# Patient Record
Sex: Female | Born: 1985 | Race: White | Hispanic: No | Marital: Married
Health system: Midwestern US, Academic
[De-identification: ages and names within clinical notes are randomized; demographics above are authoritative.]

---

## 2016-09-05 MED ORDER — ACETAMINOPHEN 500 MG PO TAB
1000 mg | Freq: Once | ORAL | 0 refills | Status: CP
Start: 2016-09-05 — End: ?

## 2016-09-05 MED ORDER — HYDROCODONE-ACETAMINOPHEN 5-325 MG PO TAB
1 | ORAL_TABLET | ORAL | 0 refills | 15.00000 days | Status: DC | PRN
Start: 2016-09-05 — End: 2017-01-19

## 2016-09-05 MED ORDER — LIDOCAINE 5 % TP PTMD
1 | MEDICATED_PATCH | Freq: Every day | TOPICAL | 1 refills | 30.00000 days | Status: DC
Start: 2016-09-05 — End: 2016-09-05

## 2016-09-05 MED ORDER — LIDOCAINE 5 % TP PTMD
1 | MEDICATED_PATCH | Freq: Every day | TOPICAL | 1 refills | 30.00000 days | Status: DC
Start: 2016-09-05 — End: 2017-01-19

## 2016-09-05 MED ORDER — LIDOCAINE 5 % TP PTMD
1 | Freq: Every day | TOPICAL | 0 refills | Status: DC
Start: 2016-09-05 — End: 2016-09-05

## 2017-01-19 ENCOUNTER — Ambulatory Visit: Admit: 2017-01-19 | Discharge: 2017-01-20 | Payer: BC Managed Care – PPO

## 2017-01-19 ENCOUNTER — Encounter: Admit: 2017-01-19 | Discharge: 2017-01-19 | Payer: BC Managed Care – PPO

## 2017-01-19 DIAGNOSIS — IMO0001 Blues: ICD-10-CM

## 2017-01-19 DIAGNOSIS — Z8619 Personal history of other infectious and parasitic diseases: Principal | ICD-10-CM

## 2017-01-19 DIAGNOSIS — D649 Anemia, unspecified: ICD-10-CM

## 2017-01-19 DIAGNOSIS — R51 Headache: ICD-10-CM

## 2017-01-19 DIAGNOSIS — Z0000 Encounter for general adult medical examination without abnormal findings: Principal | ICD-10-CM

## 2017-01-19 DIAGNOSIS — G43009 Migraine without aura, not intractable, without status migrainosus: ICD-10-CM

## 2017-01-19 DIAGNOSIS — E039 Hypothyroidism, unspecified: ICD-10-CM

## 2017-01-19 DIAGNOSIS — Z8669 Personal history of other diseases of the nervous system and sense organs: ICD-10-CM

## 2017-01-19 DIAGNOSIS — F419 Anxiety disorder, unspecified: ICD-10-CM

## 2017-01-19 LAB — CBC AND DIFF
Lab: 1.1 mL/min (ref >60)
Lab: 12 % (ref 11.5–14.5)
Lab: 12 g/dL (ref 12.00–14.00)
Lab: 2.7 mg/dL (ref 2.0–7.8)
Lab: 215 10*3/uL — ABNORMAL HIGH (ref 140–440)
Lab: 27 % (ref >60)
Lab: 29 pg (ref 26–32)
Lab: 34 g/dL (ref 31–36)
Lab: 4.2 M/uL (ref 4.20–6.30)
Lab: 63 % (ref 37.0–92.0)
Lab: 88 fL (ref 80–97)
Lab: 9.7 % (ref 0.1–24.0)
Lab: 0.4 (ref 0.0–1.8)

## 2017-01-19 MED ORDER — RIZATRIPTAN 10 MG PO TAB
10 mg | ORAL_TABLET | Freq: Every day | ORAL | 0 refills | Status: AC | PRN
Start: 2017-01-19 — End: 2017-11-01

## 2017-01-19 NOTE — Progress Notes
Date of service 01/19/2017    Kara Ponce presents today for a Complete Physical Exam.    HPI  She presents today for her physical.  She is a 93-month-old baby and 29-month-old.  She is going back to work next week.  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  She unfortunately developed fully when she was 2 [redacted] weeks pregnant and was admitted to [facility].  She delivered her baby at [facility].  She never had any dose changes with her thyroid during either of her pregnancies.  She is getting up 2-3 times a night with her children.  She does recall that she was found to have some anemia during her pregnancy.  She was put on iron every other day in addition to her prenatal vitamin.  This is causing a little bit of constipation.  She infrequently has migraines.  She used to take Maxalt and would like to have another prescription on hand just in case.    Past Medical History:   Diagnosis Date   ??? Anxiety    ??? Blues    ??? Generalized headaches    ??? History of chicken pox    ??? History of migraine headaches    ??? Hypothyroidism      Past Surgical History:   Procedure Laterality Date   ??? HX WISDOM TEETH EXTRACTION  2005   ??? CYSTOSCOPY  08/13/2009    NORMAL     Family History   Problem Relation Age of Onset   ??? Hypertension Mother    ??? Thyroid Disease Mother    ??? Heart Disease Mother    ??? Hypertension Father    ??? Stroke Father    ??? Hearing Loss Father    ??? High Cholesterol Father    ??? Diabetes Maternal Grandfather    ??? Heart Disease Paternal Grandmother    ??? Heart Disease Paternal Grandfather    ??? Anesthetic Complication Neg Hx    ??? Cancer-Breast Neg Hx    ??? Cancer-Colon Neg Hx    ??? Cancer-Ovarian Neg Hx    ??? Cancer-Uterine Neg Hx    ??? Bleeding Disorders Neg Hx    ??? VTE Neg Hx    ??? Cancer Neg Hx      Social History   Substance Use Topics   ??? Smoking status: Never Smoker   ??? Smokeless tobacco: Never Used   ??? 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  47-month-old boy and 57-month-old daughter.  Walking regularly. Review of Systems   Constitution: Negative for chills, decreased appetite, diaphoresis, fever, weakness, malaise/fatigue, night sweats and weight gain.   HENT: Negative for ear pain and hearing loss.    Eyes: Negative for visual disturbance.   Cardiovascular: Negative for chest pain, dyspnea on exertion and leg swelling.   Respiratory: Negative for cough and shortness of breath.    Skin: Negative for rash.   Musculoskeletal: Negative for joint swelling.   Gastrointestinal: Negative for abdominal pain, change in bowel habit, constipation, diarrhea, melena and nausea.   Genitourinary: Negative for bladder incontinence and dysuria.   Neurological: Negative for light-headedness and numbness.   Psychiatric/Behavioral: Negative for depression. The patient is not nervous/anxious.        Allergies   Allergen Reactions   ??? Sulfa (Sulfonamide Antibiotics)        Objective:     ??? cholecalciferol (VITAMIN D-3) 1,000 units tablet Take 1,000 Units by mouth daily.   ???  ferrous sulfate (FEOSOL, FEROSUL) 325 mg (65 mg iron) tablet Take 325 mg by mouth daily. Take on an empty stomach at least 1 hour before or 2 hours after food.   ??? fish oil- omega 3-DHA/EPA 300/1,000 mg capsule Take 1 capsule by mouth daily.   ??? levothyroxine (SYNTHROID) 75 mcg tablet TAKE 1 TABLET BY MOUTH EVERY DAY   ??? rizatriptan (MAXALT) 10 mg tablet Take one tablet by mouth daily as needed. May repeat in 2 hours in needed   ??? vitamins, prenatal w/iron & folate 65/1 mg tab Take 1 Tab by mouth daily.     Vitals:    01/19/17 1155   BP: 106/67   Pulse: 67   Resp: 16   Temp: 36.8 ???C (98.2 ???F)   TempSrc: Oral   SpO2: 98%   Weight: 62.2 kg (137 lb 3.2 oz)   Height: 158.8 cm (62.5)     Body mass index is 24.69 kg/m???.   Discussed patient's BMI with her.  The body mass index is 24.69 kg/m???.  no follow up action or recommendations are necessary at this time.    Physical Exam     General: Pleasant well developed, well nourished female in no acute distress. Heent: Pupils equal round, reactive to light. Extraocular movements intact. Mucous membranes moist, oropharynx clear. Neck: No LAD, thyromegaly or jvp. Lungs: clear throughout, normal respiratory effort. CV: Regular rate and rhythm, no murmur. 2+ distal pulses. Abdomen: Soft, non-tender, non-distended, +BS, no masses. Ext: no clubbing, cyanosis or edema. Warm and dry. Neuro: Alert and oriented x 3; CNII-XII intact, no focal deficits. Psy: Mood: good. Affect: normal.            Assessment and Plan:  1. Wellness exam: WNL. Tdap 3/18. Flu vax yrly fall. Pap 08/27/14, q 3y. IUD placed yesterday.  2.  Hypothyroidism.  Recheck thyroid function today.  On levothyroxine.  3.  Migraines.  Maxalt prescription sent.  4.  Anemia.  Recheck CBC today.  Further recommendations regarding her every other day iron to follow lab review.    1 yr sooner if needed

## 2017-01-20 ENCOUNTER — Encounter: Admit: 2017-01-20 | Discharge: 2017-01-19 | Payer: BC Managed Care – PPO

## 2017-01-20 LAB — THYROID STIMULATING HORMONE-TSH: Lab: 0.9 uU/mL (ref 0.35–5.00)

## 2017-02-12 ENCOUNTER — Encounter: Admit: 2017-02-12 | Discharge: 2017-02-12 | Payer: BC Managed Care – PPO

## 2017-02-12 MED ORDER — LEVOTHYROXINE 75 MCG PO TAB
75 ug | ORAL_TABLET | Freq: Every day | ORAL | 2 refills | 30.00000 days | Status: AC
Start: 2017-02-12 — End: 2017-12-19

## 2017-08-06 ENCOUNTER — Encounter: Admit: 2017-08-06 | Discharge: 2017-08-06 | Payer: BC Managed Care – PPO

## 2017-08-31 ENCOUNTER — Encounter: Admit: 2017-08-31 | Discharge: 2017-08-31 | Payer: BC Managed Care – PPO

## 2017-08-31 ENCOUNTER — Ambulatory Visit: Admit: 2017-08-31 | Discharge: 2017-09-01 | Payer: BC Managed Care – PPO

## 2017-08-31 DIAGNOSIS — R350 Frequency of micturition: Principal | ICD-10-CM

## 2017-08-31 DIAGNOSIS — IMO0001 Blues: ICD-10-CM

## 2017-08-31 DIAGNOSIS — E039 Hypothyroidism, unspecified: ICD-10-CM

## 2017-08-31 DIAGNOSIS — F419 Anxiety disorder, unspecified: ICD-10-CM

## 2017-08-31 DIAGNOSIS — R51 Headache: ICD-10-CM

## 2017-08-31 DIAGNOSIS — Z8669 Personal history of other diseases of the nervous system and sense organs: ICD-10-CM

## 2017-08-31 DIAGNOSIS — Z8619 Personal history of other infectious and parasitic diseases: Principal | ICD-10-CM

## 2017-08-31 LAB — URINALYSIS DIPSTICK
Lab: 0.2 (ref 0.2–1.0)
Lab: 1 (ref 1.050–1.025)
Lab: 6 mL/min (ref >60)
Lab: NEGATIVE
Lab: NEGATIVE
Lab: NEGATIVE
Lab: NEGATIVE
Lab: NEGATIVE
Lab: NEGATIVE
Lab: NEGATIVE

## 2017-09-02 LAB — CULTURE-URINE W/SENSITIVITY: Lab: 3

## 2017-09-05 ENCOUNTER — Encounter: Admit: 2017-09-05 | Discharge: 2017-09-05 | Payer: BC Managed Care – PPO

## 2017-09-05 ENCOUNTER — Ambulatory Visit: Admit: 2017-09-05 | Discharge: 2017-09-05 | Payer: BC Managed Care – PPO

## 2017-09-05 DIAGNOSIS — F419 Anxiety disorder, unspecified: ICD-10-CM

## 2017-09-05 DIAGNOSIS — Z8669 Personal history of other diseases of the nervous system and sense organs: ICD-10-CM

## 2017-09-05 DIAGNOSIS — G43909 Migraine, unspecified, not intractable, without status migrainosus: ICD-10-CM

## 2017-09-05 DIAGNOSIS — E039 Hypothyroidism, unspecified: ICD-10-CM

## 2017-09-05 DIAGNOSIS — R350 Frequency of micturition: Principal | ICD-10-CM

## 2017-09-05 DIAGNOSIS — R51 Headache: ICD-10-CM

## 2017-09-05 DIAGNOSIS — IMO0001 Blues: ICD-10-CM

## 2017-09-05 DIAGNOSIS — Z8619 Personal history of other infectious and parasitic diseases: Principal | ICD-10-CM

## 2017-09-05 LAB — URINALYSIS DIPSTICK REFLEX TO CULTURE
Lab: NEGATIVE
Lab: NEGATIVE
Lab: NEGATIVE
Lab: NEGATIVE
Lab: NEGATIVE

## 2017-09-05 LAB — URINALYSIS MICROSCOPIC REFLEX TO CULTURE

## 2017-09-06 ENCOUNTER — Encounter: Admit: 2017-09-06 | Discharge: 2017-09-06 | Payer: BC Managed Care – PPO

## 2017-10-19 ENCOUNTER — Encounter: Admit: 2017-10-19 | Discharge: 2017-10-19 | Payer: BC Managed Care – PPO

## 2017-10-19 ENCOUNTER — Ambulatory Visit: Admit: 2017-10-19 | Discharge: 2017-10-19 | Payer: BC Managed Care – PPO

## 2017-10-19 DIAGNOSIS — Z8669 Personal history of other diseases of the nervous system and sense organs: ICD-10-CM

## 2017-10-19 DIAGNOSIS — Z8619 Personal history of other infectious and parasitic diseases: Principal | ICD-10-CM

## 2017-10-19 DIAGNOSIS — E039 Hypothyroidism, unspecified: ICD-10-CM

## 2017-10-19 DIAGNOSIS — R200 Anesthesia of skin: Principal | ICD-10-CM

## 2017-10-19 DIAGNOSIS — IMO0001 Blues: ICD-10-CM

## 2017-10-19 DIAGNOSIS — G5621 Lesion of ulnar nerve, right upper limb: ICD-10-CM

## 2017-10-19 DIAGNOSIS — R51 Headache: ICD-10-CM

## 2017-10-19 DIAGNOSIS — F419 Anxiety disorder, unspecified: ICD-10-CM

## 2017-10-19 DIAGNOSIS — G43909 Migraine, unspecified, not intractable, without status migrainosus: ICD-10-CM

## 2017-11-01 ENCOUNTER — Encounter: Admit: 2017-11-01 | Discharge: 2017-11-01 | Payer: BC Managed Care – PPO

## 2017-11-01 DIAGNOSIS — G43009 Migraine without aura, not intractable, without status migrainosus: Principal | ICD-10-CM

## 2017-11-01 MED ORDER — RIZATRIPTAN 10 MG PO TAB
10 mg | ORAL_TABLET | Freq: Every day | ORAL | 1 refills | Status: AC | PRN
Start: 2017-11-01 — End: 2018-01-04

## 2017-11-30 ENCOUNTER — Encounter: Admit: 2017-11-30 | Discharge: 2017-11-30 | Payer: BC Managed Care – PPO

## 2017-11-30 ENCOUNTER — Emergency Department
Admit: 2017-11-30 | Discharge: 2017-11-30 | Disposition: A | Discharge status: Home / Self Care | Payer: BC Managed Care – PPO

## 2017-11-30 ENCOUNTER — Emergency Department: Admit: 2017-11-30 | Discharge: 2017-11-30 | Payer: BC Managed Care – PPO

## 2017-11-30 DIAGNOSIS — K7689 Other specified diseases of liver: ICD-10-CM

## 2017-11-30 DIAGNOSIS — M6289 Other specified disorders of muscle: ICD-10-CM

## 2017-11-30 DIAGNOSIS — I82890 Acute embolism and thrombosis of other specified veins: Principal | ICD-10-CM

## 2017-11-30 DIAGNOSIS — K769 Liver disease, unspecified: Principal | ICD-10-CM

## 2017-11-30 DIAGNOSIS — K5900 Constipation, unspecified: ICD-10-CM

## 2017-11-30 DIAGNOSIS — Z8719 Personal history of other diseases of the digestive system: ICD-10-CM

## 2017-11-30 LAB — COMPREHENSIVE METABOLIC PANEL
Lab: 138 MMOL/L — ABNORMAL HIGH (ref 137–147)
Lab: 6 10*3/uL — ABNORMAL LOW (ref 3–12)
Lab: >60 mL/min (ref >60)
Lab: >60 mL/min (ref >60)

## 2017-11-30 LAB — URINALYSIS, MICROSCOPIC

## 2017-11-30 LAB — CBC AND DIFF
Lab: 0 10*3/uL (ref 0–0.20)
Lab: 0.1 10*3/uL (ref 0–0.45)
Lab: 3.7 10*3/uL — ABNORMAL LOW (ref 4.5–11.0)

## 2017-11-30 LAB — URINALYSIS DIPSTICK
Lab: NEGATIVE MMOL/L (ref 21–30)
Lab: NEGATIVE U/L (ref 7–40)
Lab: NEGATIVE U/L — ABNORMAL LOW (ref 7–56)
Lab: NEGATIVE g/dL (ref 3.5–5.0)
Lab: NEGATIVE g/dL (ref 6.0–8.0)
Lab: NEGATIVE mg/dL (ref 0.3–1.2)
Lab: NEGATIVE mg/dL (ref 8.5–10.6)

## 2017-11-30 LAB — POC CREATININE, RAD: Lab: 0.7 mg/dL — ABNORMAL LOW (ref 0.4–1.00)

## 2017-11-30 LAB — LIPASE: Lab: 26 U/L — AB (ref 11–82)

## 2017-11-30 MED ORDER — SODIUM CHLORIDE 0.9 % IJ SOLN
50 mL | Freq: Once | INTRAVENOUS | 0 refills | Status: CP
Start: 2017-11-30 — End: ?
  Administered 2017-11-30: 13:00:00 50 mL via INTRAVENOUS

## 2017-11-30 MED ORDER — MILK/MOLASSES(#) 1:1 RECTAL ENEMA
1 | Freq: Once | RECTAL | 0 refills | Status: CP
Start: 2017-11-30 — End: ?
  Administered 2017-11-30: 14:00:00 200 mL via RECTAL

## 2017-11-30 MED ORDER — LACTATED RINGERS IV SOLP
1000 mL | INTRAVENOUS | 0 refills | Status: CP
Start: 2017-11-30 — End: ?
  Administered 2017-11-30: 13:00:00 1000 mL via INTRAVENOUS

## 2017-11-30 MED ORDER — IOHEXOL 350 MG IODINE/ML IV SOLN
70 mL | Freq: Once | INTRAVENOUS | 0 refills | Status: CP
Start: 2017-11-30 — End: ?
  Administered 2017-11-30: 13:00:00 70 mL via INTRAVENOUS

## 2017-12-04 ENCOUNTER — Encounter: Admit: 2017-12-04 | Discharge: 2017-12-04 | Payer: BC Managed Care – PPO

## 2017-12-04 DIAGNOSIS — K7689 Other specified diseases of liver: Principal | ICD-10-CM

## 2017-12-04 DIAGNOSIS — K5900 Constipation, unspecified: Principal | ICD-10-CM

## 2017-12-04 MED ORDER — SODIUM CHLORIDE 0.9 % IV SOLP
INTRAVENOUS | 0 refills | Status: CN
Start: 2017-12-04 — End: ?

## 2017-12-05 ENCOUNTER — Encounter: Admit: 2017-12-05 | Discharge: 2017-12-05 | Payer: BC Managed Care – PPO

## 2017-12-14 ENCOUNTER — Ambulatory Visit: Admit: 2017-12-14 | Discharge: 2017-12-14 | Payer: BC Managed Care – PPO

## 2017-12-14 DIAGNOSIS — K769 Liver disease, unspecified: Principal | ICD-10-CM

## 2017-12-14 MED ORDER — GADOBENATE DIMEGLUMINE 529 MG/ML (0.1MMOL/0.2ML) IV SOLN
12 mL | Freq: Once | INTRAVENOUS | 0 refills | Status: CP
Start: 2017-12-14 — End: ?
  Administered 2017-12-14: 14:00:00 12 mL via INTRAVENOUS

## 2017-12-14 MED ORDER — SODIUM CHLORIDE 0.9 % IJ SOLN
50 mL | Freq: Once | INTRAVENOUS | 0 refills | Status: DC
Start: 2017-12-14 — End: 2017-12-19

## 2017-12-17 ENCOUNTER — Encounter: Admit: 2017-12-17 | Discharge: 2017-12-17 | Payer: BC Managed Care – PPO

## 2017-12-19 ENCOUNTER — Encounter: Admit: 2017-12-19 | Discharge: 2017-12-19 | Payer: BC Managed Care – PPO

## 2017-12-19 MED ORDER — LEVOTHYROXINE 75 MCG PO TAB
ORAL_TABLET | Freq: Every day | ORAL | 1 refills | 30.00000 days | Status: AC
Start: 2017-12-19 — End: 2018-01-07

## 2017-12-20 ENCOUNTER — Encounter: Admit: 2017-12-20 | Discharge: 2017-12-20 | Payer: BC Managed Care – PPO

## 2017-12-20 DIAGNOSIS — R140 Abdominal distension (gaseous): Principal | ICD-10-CM

## 2017-12-24 ENCOUNTER — Encounter: Admit: 2017-12-24 | Discharge: 2017-12-24 | Payer: BC Managed Care – PPO

## 2017-12-24 DIAGNOSIS — G5621 Lesion of ulnar nerve, right upper limb: Principal | ICD-10-CM

## 2017-12-26 ENCOUNTER — Encounter: Admit: 2017-12-26 | Discharge: 2017-12-26 | Payer: BC Managed Care – PPO

## 2017-12-26 ENCOUNTER — Ambulatory Visit: Admit: 2017-12-26 | Discharge: 2017-12-26 | Payer: BC Managed Care – PPO

## 2017-12-26 DIAGNOSIS — Z882 Allergy status to sulfonamides status: ICD-10-CM

## 2017-12-26 DIAGNOSIS — K6289 Other specified diseases of anus and rectum: Principal | ICD-10-CM

## 2017-12-26 DIAGNOSIS — F419 Anxiety disorder, unspecified: ICD-10-CM

## 2017-12-26 DIAGNOSIS — E039 Hypothyroidism, unspecified: ICD-10-CM

## 2017-12-26 DIAGNOSIS — Z8619 Personal history of other infectious and parasitic diseases: Principal | ICD-10-CM

## 2017-12-26 DIAGNOSIS — IMO0001 Blues: ICD-10-CM

## 2017-12-26 DIAGNOSIS — Z8669 Personal history of other diseases of the nervous system and sense organs: ICD-10-CM

## 2017-12-26 DIAGNOSIS — G43909 Migraine, unspecified, not intractable, without status migrainosus: ICD-10-CM

## 2017-12-26 DIAGNOSIS — R51 Headache: ICD-10-CM

## 2017-12-26 DIAGNOSIS — K644 Residual hemorrhoidal skin tags: Principal | ICD-10-CM

## 2017-12-26 DIAGNOSIS — K5900 Constipation, unspecified: ICD-10-CM

## 2017-12-26 MED ORDER — SODIUM CHLORIDE 0.9 % IV SOLP
INTRAVENOUS | 0 refills | Status: CN
Start: 2017-12-26 — End: ?

## 2017-12-26 MED ORDER — LACTATED RINGERS IV SOLP
INTRAVENOUS | 0 refills | Status: DC
Start: 2017-12-26 — End: 2017-12-26

## 2017-12-27 ENCOUNTER — Encounter: Admit: 2017-12-27 | Discharge: 2017-12-27 | Payer: BC Managed Care – PPO

## 2017-12-27 DIAGNOSIS — Z8619 Personal history of other infectious and parasitic diseases: Principal | ICD-10-CM

## 2017-12-27 DIAGNOSIS — Z8669 Personal history of other diseases of the nervous system and sense organs: ICD-10-CM

## 2017-12-27 DIAGNOSIS — IMO0001 Blues: ICD-10-CM

## 2017-12-27 DIAGNOSIS — F419 Anxiety disorder, unspecified: ICD-10-CM

## 2017-12-27 DIAGNOSIS — R51 Headache: ICD-10-CM

## 2017-12-27 DIAGNOSIS — E039 Hypothyroidism, unspecified: ICD-10-CM

## 2017-12-27 DIAGNOSIS — G43909 Migraine, unspecified, not intractable, without status migrainosus: ICD-10-CM

## 2017-12-28 ENCOUNTER — Encounter: Admit: 2017-12-28 | Discharge: 2017-12-28 | Payer: BC Managed Care – PPO

## 2018-01-04 ENCOUNTER — Ambulatory Visit: Admit: 2018-01-04 | Discharge: 2018-01-05 | Payer: BC Managed Care – PPO

## 2018-01-04 ENCOUNTER — Encounter: Admit: 2018-01-04 | Discharge: 2018-01-04 | Payer: BC Managed Care – PPO

## 2018-01-04 DIAGNOSIS — K644 Residual hemorrhoidal skin tags: ICD-10-CM

## 2018-01-04 DIAGNOSIS — Z8669 Personal history of other diseases of the nervous system and sense organs: ICD-10-CM

## 2018-01-04 DIAGNOSIS — Z8619 Personal history of other infectious and parasitic diseases: Principal | ICD-10-CM

## 2018-01-04 DIAGNOSIS — G43909 Migraine, unspecified, not intractable, without status migrainosus: ICD-10-CM

## 2018-01-04 DIAGNOSIS — E039 Hypothyroidism, unspecified: ICD-10-CM

## 2018-01-04 DIAGNOSIS — I82890 Acute embolism and thrombosis of other specified veins: Secondary | ICD-10-CM

## 2018-01-04 DIAGNOSIS — F419 Anxiety disorder, unspecified: ICD-10-CM

## 2018-01-04 DIAGNOSIS — IMO0001 Blues: ICD-10-CM

## 2018-01-04 DIAGNOSIS — R51 Headache: ICD-10-CM

## 2018-01-04 MED ORDER — RIZATRIPTAN 10 MG PO TAB
10 mg | ORAL_TABLET | Freq: Every day | ORAL | 1 refills | Status: AC | PRN
Start: 2018-01-04 — End: 2018-03-20

## 2018-01-04 MED ORDER — HYDROCORTISONE 2.5 % TP CRPE
TOPICAL | 1 refills | 30.00000 days | Status: AC | PRN
Start: 2018-01-04 — End: 2018-01-08

## 2018-01-05 DIAGNOSIS — E039 Hypothyroidism, unspecified: Principal | ICD-10-CM

## 2018-01-05 DIAGNOSIS — G43009 Migraine without aura, not intractable, without status migrainosus: ICD-10-CM

## 2018-01-05 DIAGNOSIS — K7689 Other specified diseases of liver: ICD-10-CM

## 2018-01-07 ENCOUNTER — Encounter: Admit: 2018-01-07 | Discharge: 2018-01-07 | Payer: BC Managed Care – PPO

## 2018-01-07 ENCOUNTER — Ambulatory Visit: Admit: 2018-01-07 | Discharge: 2018-01-07 | Payer: BC Managed Care – PPO

## 2018-01-07 DIAGNOSIS — IMO0001 Blues: ICD-10-CM

## 2018-01-07 DIAGNOSIS — G43909 Migraine, unspecified, not intractable, without status migrainosus: ICD-10-CM

## 2018-01-07 DIAGNOSIS — F419 Anxiety disorder, unspecified: ICD-10-CM

## 2018-01-07 DIAGNOSIS — Z8669 Personal history of other diseases of the nervous system and sense organs: ICD-10-CM

## 2018-01-07 DIAGNOSIS — E039 Hypothyroidism, unspecified: ICD-10-CM

## 2018-01-07 DIAGNOSIS — R51 Headache: ICD-10-CM

## 2018-01-07 DIAGNOSIS — Z8619 Personal history of other infectious and parasitic diseases: Principal | ICD-10-CM

## 2018-01-07 LAB — TSH WITH FREE T4 REFLEX: Lab: 1.5 uU/mL (ref 0.35–5.00)

## 2018-01-07 LAB — LIPID PROFILE
Lab: 10 mg/dL
Lab: 152 mg/dL (ref <200)
Lab: 52 mg/dL (ref <150)
Lab: 58 mg/dL (ref >40)
Lab: 87 mg/dL (ref <100)
Lab: 94 mg/dL

## 2018-01-07 MED ORDER — LEVOTHYROXINE 75 MCG PO TAB
75 ug | ORAL_TABLET | Freq: Every day | ORAL | 3 refills | 30.00000 days | Status: AC
Start: 2018-01-07 — End: 2019-02-03

## 2018-01-08 ENCOUNTER — Encounter: Admit: 2018-01-08 | Discharge: 2018-01-08 | Payer: BC Managed Care – PPO

## 2018-01-08 ENCOUNTER — Ambulatory Visit: Admit: 2018-01-08 | Discharge: 2018-01-08 | Payer: BC Managed Care – PPO

## 2018-01-08 DIAGNOSIS — Z8669 Personal history of other diseases of the nervous system and sense organs: ICD-10-CM

## 2018-01-08 DIAGNOSIS — Z8619 Personal history of other infectious and parasitic diseases: Principal | ICD-10-CM

## 2018-01-08 DIAGNOSIS — E039 Hypothyroidism, unspecified: ICD-10-CM

## 2018-01-08 DIAGNOSIS — F419 Anxiety disorder, unspecified: ICD-10-CM

## 2018-01-08 DIAGNOSIS — I82890 Acute embolism and thrombosis of other specified veins: Principal | ICD-10-CM

## 2018-01-09 ENCOUNTER — Encounter: Admit: 2018-01-09 | Discharge: 2018-01-09 | Payer: BC Managed Care – PPO

## 2018-01-11 ENCOUNTER — Ambulatory Visit: Admit: 2018-01-11 | Discharge: 2018-01-11 | Payer: BC Managed Care – PPO

## 2018-01-11 LAB — PROTHROMBIN G20210A MUTATION(FACT 2 MUT): Lab: NEGATIVE

## 2018-01-14 ENCOUNTER — Encounter: Admit: 2018-01-14 | Discharge: 2018-01-14 | Payer: BC Managed Care – PPO

## 2018-01-14 ENCOUNTER — Ambulatory Visit: Admit: 2018-01-14 | Discharge: 2018-01-15 | Payer: BC Managed Care – PPO

## 2018-01-14 ENCOUNTER — Ambulatory Visit: Admit: 2018-01-14 | Discharge: 2018-01-14 | Payer: BC Managed Care – PPO

## 2018-01-14 DIAGNOSIS — F419 Anxiety disorder, unspecified: ICD-10-CM

## 2018-01-14 DIAGNOSIS — E039 Hypothyroidism, unspecified: ICD-10-CM

## 2018-01-14 DIAGNOSIS — Z8669 Personal history of other diseases of the nervous system and sense organs: ICD-10-CM

## 2018-01-14 DIAGNOSIS — Z8619 Personal history of other infectious and parasitic diseases: Principal | ICD-10-CM

## 2018-01-14 MED ORDER — FENTANYL CITRATE (PF) 50 MCG/ML IJ SOLN
50 ug | INTRAVENOUS | 0 refills | Status: DC | PRN
Start: 2018-01-14 — End: 2018-01-19

## 2018-01-14 MED ORDER — LIDOCAINE (PF) 200 MG/10 ML (2 %) IJ SYRG
0 refills | Status: DC
Start: 2018-01-14 — End: 2018-01-14

## 2018-01-14 MED ORDER — ONDANSETRON HCL (PF) 4 MG/2 ML IJ SOLN
INTRAVENOUS | 0 refills | Status: DC
Start: 2018-01-14 — End: 2018-01-14

## 2018-01-14 MED ORDER — LACTATED RINGERS IV SOLP
1000 mL | INTRAVENOUS | 0 refills | Status: DC
Start: 2018-01-14 — End: 2018-01-19

## 2018-01-14 MED ORDER — KETOROLAC 30 MG/ML (1 ML) IJ SOLN
0 refills | Status: DC
Start: 2018-01-14 — End: 2018-01-14

## 2018-01-14 MED ORDER — MEPERIDINE (PF) 25 MG/ML IJ SYRG
12.5 mg | INTRAVENOUS | 0 refills | Status: DC | PRN
Start: 2018-01-14 — End: 2018-01-19

## 2018-01-14 MED ORDER — EPHEDRINE SULFATE 50 MG/ML IJ SOLN
0 refills | Status: DC
Start: 2018-01-14 — End: 2018-01-14

## 2018-01-14 MED ORDER — OXYCODONE 5 MG PO TAB
5-10 mg | Freq: Once | ORAL | 0 refills | Status: AC | PRN
Start: 2018-01-14 — End: ?

## 2018-01-14 MED ORDER — CEFAZOLIN 1 GRAM IJ SOLR
0 refills | Status: DC
Start: 2018-01-14 — End: 2018-01-14

## 2018-01-14 MED ORDER — HYDROCODONE-ACETAMINOPHEN 5-325 MG PO TAB
1 | ORAL_TABLET | ORAL | 0 refills | Status: SS | PRN
Start: 2018-01-14 — End: 2018-02-15

## 2018-01-14 MED ORDER — ONDANSETRON 4 MG PO TBDI
4 mg | Freq: Once | ORAL | 0 refills | Status: AC | PRN
Start: 2018-01-14 — End: ?

## 2018-01-14 MED ORDER — FENTANYL CITRATE (PF) 50 MCG/ML IJ SOLN
0 refills | Status: DC
Start: 2018-01-14 — End: 2018-01-14

## 2018-01-14 MED ORDER — ONDANSETRON HCL (PF) 4 MG/2 ML IJ SOLN
4 mg | Freq: Once | INTRAVENOUS | 0 refills | Status: AC | PRN
Start: 2018-01-14 — End: ?

## 2018-01-14 MED ORDER — PROPOFOL INJ 10 MG/ML IV VIAL
0 refills | Status: DC
Start: 2018-01-14 — End: 2018-01-14

## 2018-01-14 MED ORDER — PROMETHAZINE 25 MG/ML IJ SOLN
6.25 mg | INTRAVENOUS | 0 refills | Status: DC | PRN
Start: 2018-01-14 — End: 2018-01-19

## 2018-01-14 MED ORDER — FENTANYL CITRATE (PF) 50 MCG/ML IJ SOLN
25 ug | INTRAVENOUS | 0 refills | Status: DC | PRN
Start: 2018-01-14 — End: 2018-01-19

## 2018-01-14 MED ORDER — ROPIVACAINE (PF) 2 MG/ML (0.2 %) IJ SOLN
0 refills | Status: DC
Start: 2018-01-14 — End: 2018-01-19

## 2018-01-14 MED ORDER — ACETAMINOPHEN 1,000 MG/100 ML (10 MG/ML) IV SOLN
0 refills | Status: DC
Start: 2018-01-14 — End: 2018-01-14

## 2018-01-14 MED ORDER — GLYCOPYRROLATE 0.2 MG/ML IJ SOLN
.2 mg | Freq: Once | INTRAVENOUS | 0 refills | Status: AC | PRN
Start: 2018-01-14 — End: ?

## 2018-01-14 MED ORDER — DIPHENHYDRAMINE HCL 50 MG/ML IJ SOLN
25 mg | Freq: Once | INTRAVENOUS | 0 refills | Status: AC | PRN
Start: 2018-01-14 — End: ?

## 2018-01-14 MED ORDER — DEXAMETHASONE SODIUM PHOSPHATE 4 MG/ML IJ SOLN
INTRAVENOUS | 0 refills | Status: DC
Start: 2018-01-14 — End: 2018-01-14

## 2018-01-14 MED ORDER — BACITRACIN 50,000 UN NS 500 ML IRR BOT (OR)
0 refills | Status: DC
Start: 2018-01-14 — End: 2018-01-19

## 2018-01-14 MED FILL — HYDROCODONE-ACETAMINOPHEN 5-325 MG PO TAB: 5/325 mg | ORAL | 3 days supply | Qty: 15 | Fill #1 | Status: CP

## 2018-01-15 ENCOUNTER — Encounter: Admit: 2018-01-15 | Discharge: 2018-01-15 | Payer: BC Managed Care – PPO

## 2018-01-15 DIAGNOSIS — E039 Hypothyroidism, unspecified: ICD-10-CM

## 2018-01-15 DIAGNOSIS — Z8669 Personal history of other diseases of the nervous system and sense organs: ICD-10-CM

## 2018-01-15 DIAGNOSIS — F419 Anxiety disorder, unspecified: ICD-10-CM

## 2018-01-15 DIAGNOSIS — Z8619 Personal history of other infectious and parasitic diseases: Principal | ICD-10-CM

## 2018-01-16 LAB — PROTEIN S SCREEN: Lab: 83 % (ref 54.7–123.7)

## 2018-01-16 LAB — FACTOR V LEIDEN SCREEN W CONFIRM: Lab: 3.1 ratio (ref >2.3)

## 2018-01-16 LAB — PROTEIN C ACTIVITY: Lab: 102 % (ref 70–130)

## 2018-01-16 LAB — ANTITHROMBIN III (AT3): Lab: 99 % (ref 80–120)

## 2018-01-25 ENCOUNTER — Encounter: Admit: 2018-01-25 | Discharge: 2018-01-25 | Payer: BC Managed Care – PPO

## 2018-01-25 ENCOUNTER — Ambulatory Visit: Admit: 2018-01-25 | Discharge: 2018-01-26 | Payer: BC Managed Care – PPO

## 2018-01-25 DIAGNOSIS — E039 Hypothyroidism, unspecified: ICD-10-CM

## 2018-01-25 DIAGNOSIS — F419 Anxiety disorder, unspecified: ICD-10-CM

## 2018-01-25 DIAGNOSIS — Z8619 Personal history of other infectious and parasitic diseases: Principal | ICD-10-CM

## 2018-01-25 DIAGNOSIS — Z8669 Personal history of other diseases of the nervous system and sense organs: ICD-10-CM

## 2018-01-25 DIAGNOSIS — G5621 Lesion of ulnar nerve, right upper limb: Principal | ICD-10-CM

## 2018-02-12 ENCOUNTER — Encounter: Admit: 2018-02-12 | Discharge: 2018-02-12 | Payer: BC Managed Care – PPO

## 2018-02-15 ENCOUNTER — Ambulatory Visit: Admit: 2018-02-15 | Discharge: 2018-02-15 | Payer: BC Managed Care – PPO

## 2018-02-15 ENCOUNTER — Encounter: Admit: 2018-02-15 | Discharge: 2018-02-15 | Payer: BC Managed Care – PPO

## 2018-02-15 DIAGNOSIS — F419 Anxiety disorder, unspecified: ICD-10-CM

## 2018-02-15 DIAGNOSIS — K5900 Constipation, unspecified: ICD-10-CM

## 2018-02-15 DIAGNOSIS — K6289 Other specified diseases of anus and rectum: Principal | ICD-10-CM

## 2018-02-15 DIAGNOSIS — Z8619 Personal history of other infectious and parasitic diseases: Principal | ICD-10-CM

## 2018-02-15 DIAGNOSIS — Z8669 Personal history of other diseases of the nervous system and sense organs: ICD-10-CM

## 2018-02-15 DIAGNOSIS — E039 Hypothyroidism, unspecified: ICD-10-CM

## 2018-03-12 ENCOUNTER — Encounter: Admit: 2018-03-12 | Discharge: 2018-03-12 | Payer: BC Managed Care – PPO

## 2018-03-12 DIAGNOSIS — M6289 Other specified disorders of muscle: Principal | ICD-10-CM

## 2018-03-20 ENCOUNTER — Encounter: Admit: 2018-03-20 | Discharge: 2018-03-20 | Payer: BC Managed Care – PPO

## 2018-03-20 DIAGNOSIS — G43009 Migraine without aura, not intractable, without status migrainosus: Principal | ICD-10-CM

## 2018-03-20 MED ORDER — RIZATRIPTAN 10 MG PO TAB
10 mg | ORAL_TABLET | Freq: Every day | ORAL | 1 refills | Status: AC | PRN
Start: 2018-03-20 — End: 2018-06-05

## 2018-03-28 ENCOUNTER — Encounter: Admit: 2018-03-28 | Discharge: 2018-03-28 | Payer: BC Managed Care – PPO

## 2018-03-28 DIAGNOSIS — E559 Vitamin D deficiency, unspecified: Principal | ICD-10-CM

## 2018-03-29 ENCOUNTER — Ambulatory Visit: Admit: 2018-03-29 | Discharge: 2018-03-29 | Payer: BC Managed Care – PPO

## 2018-03-29 DIAGNOSIS — E559 Vitamin D deficiency, unspecified: Principal | ICD-10-CM

## 2018-03-30 LAB — 25-OH VITAMIN D (D2 + D3): Lab: 38 ng/mL (ref 30–80)

## 2018-04-25 ENCOUNTER — Encounter: Admit: 2018-04-25 | Discharge: 2018-04-25 | Payer: BC Managed Care – PPO

## 2018-04-30 ENCOUNTER — Encounter: Admit: 2018-04-30 | Discharge: 2018-04-30 | Payer: BC Managed Care – PPO

## 2018-04-30 ENCOUNTER — Ambulatory Visit: Admit: 2018-04-30 | Discharge: 2018-04-30 | Payer: BC Managed Care – PPO

## 2018-04-30 DIAGNOSIS — S9304XA Dislocation of right ankle joint, initial encounter: ICD-10-CM

## 2018-04-30 DIAGNOSIS — M84374A Stress fracture, right foot, initial encounter for fracture: ICD-10-CM

## 2018-04-30 DIAGNOSIS — M79671 Pain in right foot: Principal | ICD-10-CM

## 2018-05-17 ENCOUNTER — Ambulatory Visit: Admit: 2018-05-17 | Discharge: 2018-05-17 | Payer: BC Managed Care – PPO

## 2018-05-17 DIAGNOSIS — K7689 Other specified diseases of liver: Principal | ICD-10-CM

## 2018-05-17 MED ORDER — SODIUM CHLORIDE 0.9 % IJ SOLN
50 mL | Freq: Once | INTRAVENOUS | 0 refills | Status: DC
Start: 2018-05-17 — End: 2018-05-22

## 2018-05-17 MED ORDER — GADOBENATE DIMEGLUMINE 529 MG/ML (0.1MMOL/0.2ML) IV SOLN
12 mL | Freq: Once | INTRAVENOUS | 0 refills | Status: CP
Start: 2018-05-17 — End: ?
  Administered 2018-05-17: 14:00:00 12 mL via INTRAVENOUS

## 2018-05-24 ENCOUNTER — Ambulatory Visit: Admit: 2018-05-24 | Discharge: 2018-05-24 | Payer: BC Managed Care – PPO

## 2018-05-24 DIAGNOSIS — S9304XA Dislocation of right ankle joint, initial encounter: Secondary | ICD-10-CM

## 2018-05-24 DIAGNOSIS — M79671 Pain in right foot: Secondary | ICD-10-CM

## 2018-05-24 DIAGNOSIS — M84374A Stress fracture, right foot, initial encounter for fracture: Secondary | ICD-10-CM

## 2018-06-05 ENCOUNTER — Encounter: Admit: 2018-06-05 | Discharge: 2018-06-05 | Payer: BC Managed Care – PPO

## 2018-06-05 DIAGNOSIS — G43009 Migraine without aura, not intractable, without status migrainosus: Secondary | ICD-10-CM

## 2018-06-05 MED ORDER — RIZATRIPTAN 10 MG PO TAB
10 mg | ORAL_TABLET | Freq: Every day | ORAL | 1 refills | Status: AC | PRN
Start: 2018-06-05 — End: 2018-08-09

## 2018-06-06 ENCOUNTER — Ambulatory Visit: Admit: 2018-06-06 | Discharge: 2018-06-07 | Payer: BC Managed Care – PPO

## 2018-06-06 DIAGNOSIS — M25531 Pain in right wrist: ICD-10-CM

## 2018-06-07 DIAGNOSIS — G5603 Carpal tunnel syndrome, bilateral upper limbs: Secondary | ICD-10-CM

## 2018-06-21 ENCOUNTER — Ambulatory Visit: Admit: 2018-06-21 | Discharge: 2018-06-22 | Payer: BC Managed Care – PPO

## 2018-06-21 ENCOUNTER — Encounter: Admit: 2018-06-21 | Discharge: 2018-06-21 | Payer: BC Managed Care – PPO

## 2018-06-21 DIAGNOSIS — F419 Anxiety disorder, unspecified: Secondary | ICD-10-CM

## 2018-06-21 DIAGNOSIS — Z8669 Personal history of other diseases of the nervous system and sense organs: Secondary | ICD-10-CM

## 2018-06-21 DIAGNOSIS — E039 Hypothyroidism, unspecified: Secondary | ICD-10-CM

## 2018-06-21 DIAGNOSIS — Z8619 Personal history of other infectious and parasitic diseases: Secondary | ICD-10-CM

## 2018-06-22 DIAGNOSIS — R200 Anesthesia of skin: Secondary | ICD-10-CM

## 2018-06-26 ENCOUNTER — Encounter: Admit: 2018-06-26 | Discharge: 2018-06-26 | Payer: BC Managed Care – PPO

## 2018-06-26 ENCOUNTER — Ambulatory Visit: Admit: 2018-06-26 | Discharge: 2018-06-27 | Payer: BC Managed Care – PPO

## 2018-06-26 DIAGNOSIS — Z8669 Personal history of other diseases of the nervous system and sense organs: ICD-10-CM

## 2018-06-26 DIAGNOSIS — Z8619 Personal history of other infectious and parasitic diseases: Principal | ICD-10-CM

## 2018-06-26 DIAGNOSIS — E039 Hypothyroidism, unspecified: ICD-10-CM

## 2018-06-26 DIAGNOSIS — F419 Anxiety disorder, unspecified: ICD-10-CM

## 2018-06-27 DIAGNOSIS — G5603 Carpal tunnel syndrome, bilateral upper limbs: Principal | ICD-10-CM

## 2018-07-12 ENCOUNTER — Ambulatory Visit: Admit: 2018-07-12 | Discharge: 2018-07-12 | Payer: BC Managed Care – PPO

## 2018-07-12 DIAGNOSIS — G5603 Carpal tunnel syndrome, bilateral upper limbs: Principal | ICD-10-CM

## 2018-07-12 MED ORDER — LIDOCAINE HCL 10 MG/ML (1 %) IJ SOLN
2 mL | Freq: Once | INTRAMUSCULAR | 0 refills | Status: CP
Start: 2018-07-12 — End: ?
  Administered 2018-07-12: 18:00:00 2 mL via INTRAMUSCULAR

## 2018-07-12 MED ORDER — TRIAMCINOLONE ACETONIDE 40 MG/ML IJ SUSP
80 mg | Freq: Once | INTRAMUSCULAR | 0 refills | Status: CP
Start: 2018-07-12 — End: ?
  Administered 2018-07-12: 18:00:00 80 mg via INTRAMUSCULAR

## 2018-07-24 ENCOUNTER — Encounter: Admit: 2018-07-24 | Discharge: 2018-07-24 | Payer: BC Managed Care – PPO

## 2018-08-08 ENCOUNTER — Encounter: Admit: 2018-08-08 | Discharge: 2018-08-08 | Payer: BC Managed Care – PPO

## 2018-08-08 DIAGNOSIS — G43009 Migraine without aura, not intractable, without status migrainosus: Principal | ICD-10-CM

## 2018-08-09 MED ORDER — RIZATRIPTAN 10 MG PO TAB
ORAL_TABLET | Freq: Once | 1 refills | Status: AC
Start: 2018-08-09 — End: 2018-10-07

## 2018-08-15 IMAGING — CR DX Hand Complete RT
4 series · 4 of 4 positions shown · non-contrast
Comparison: none

EXAMINATION: Right hand 4 views
CLINICAL INFORMATION: Pain.

[PA]
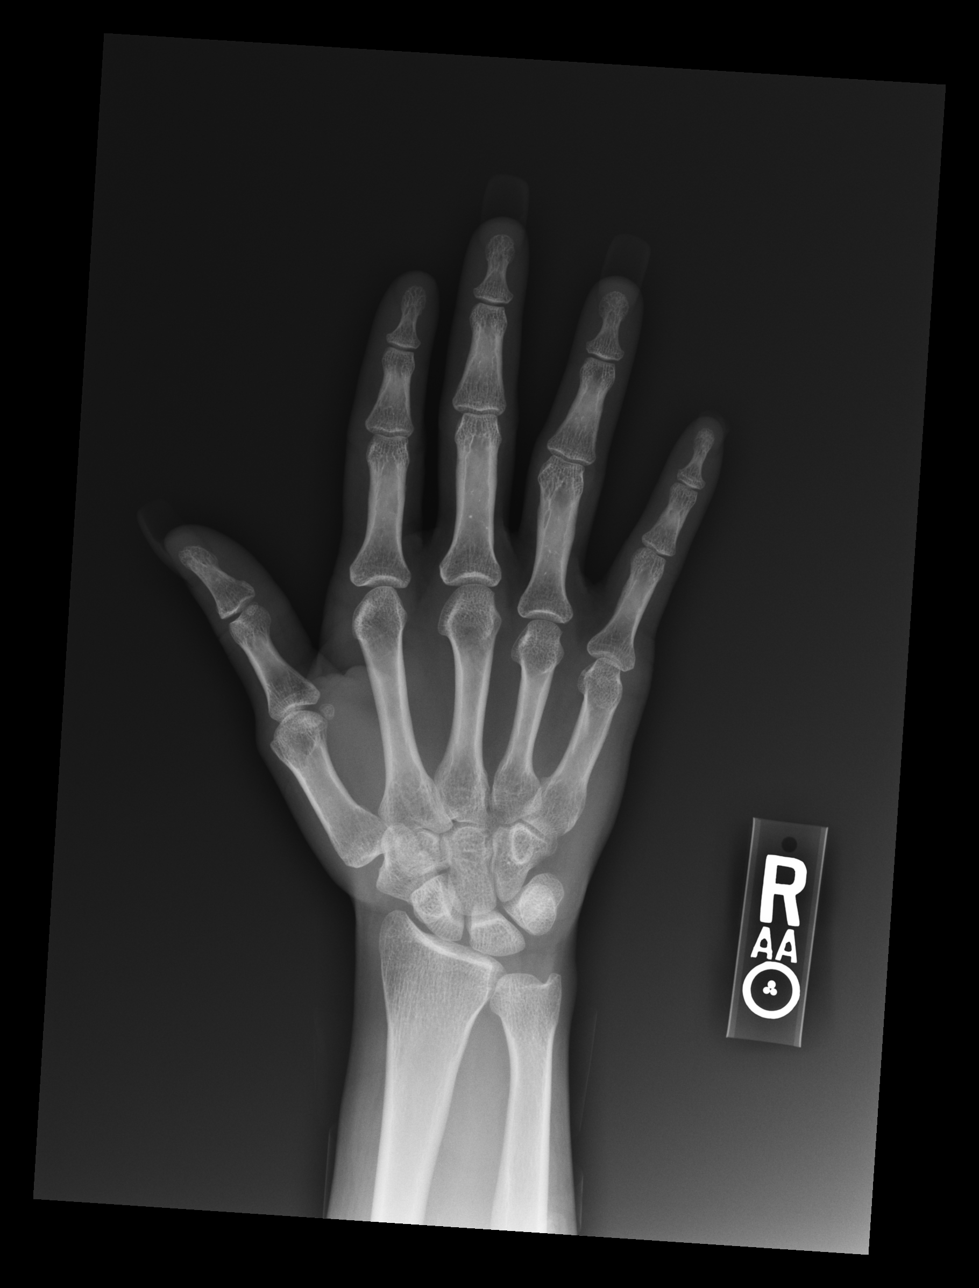

[pa obl (1 of 2)]
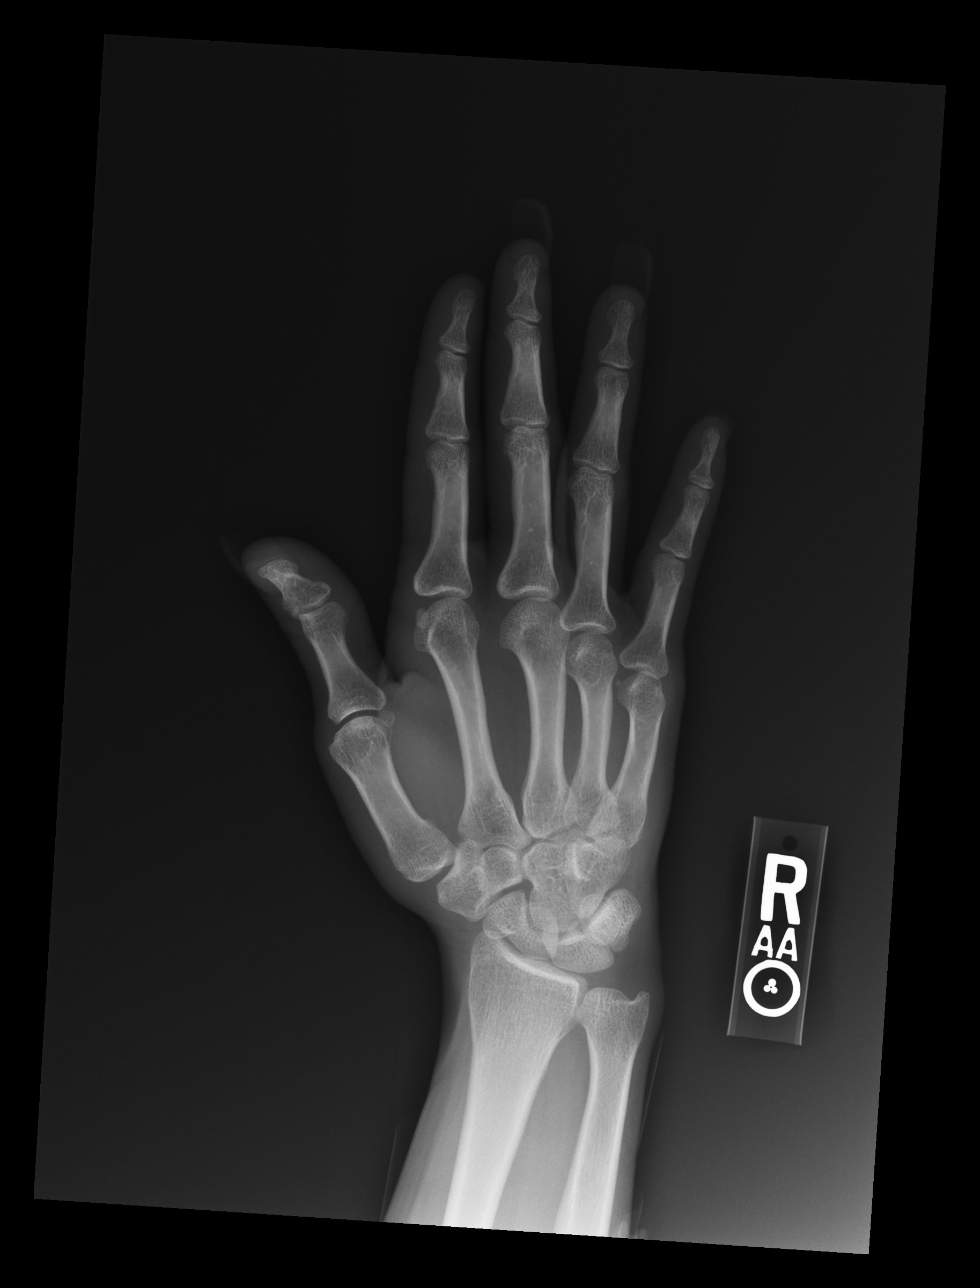

[lateral]
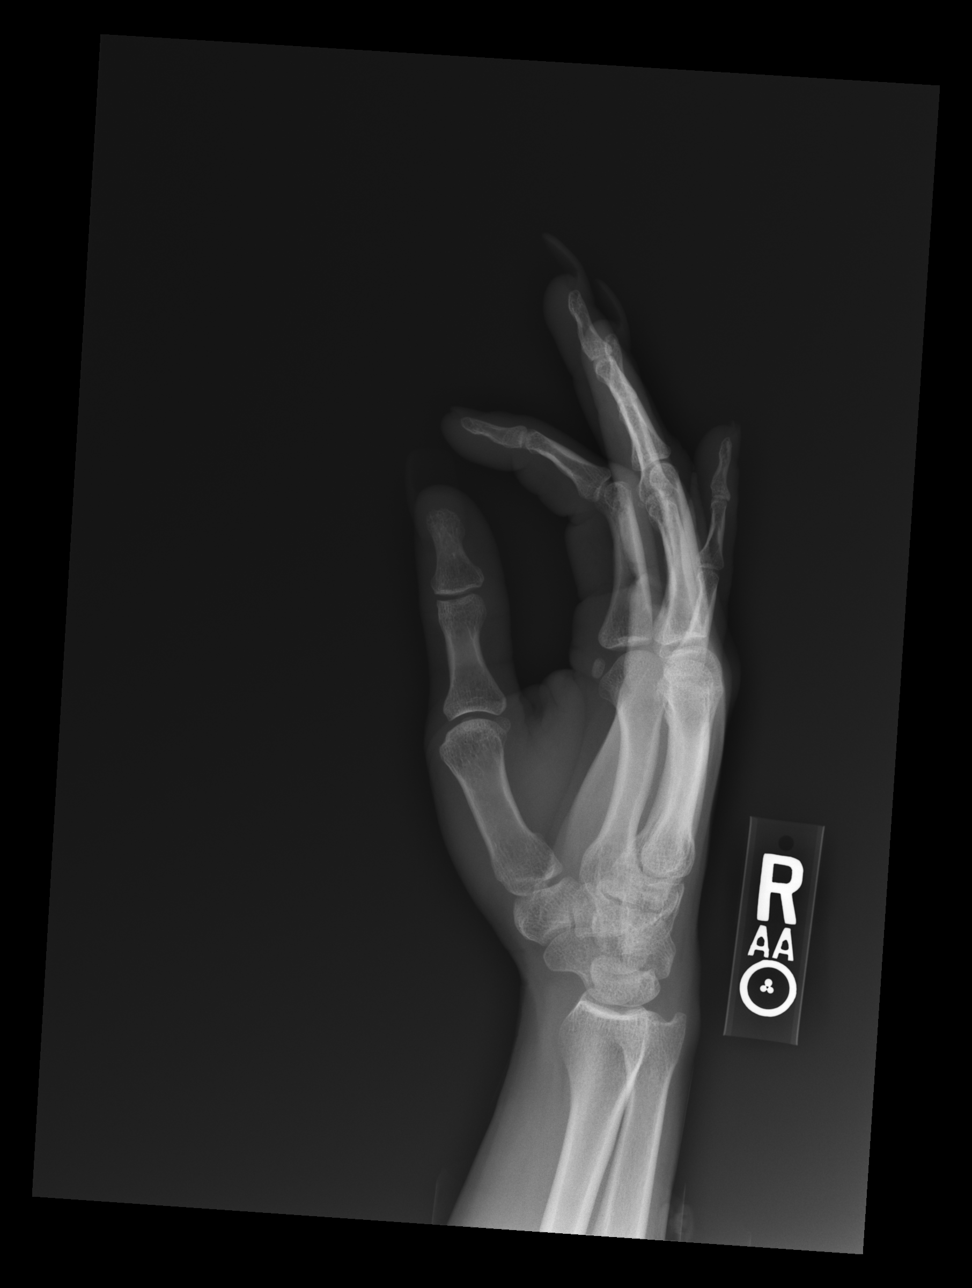

[pa obl (2 of 2)]
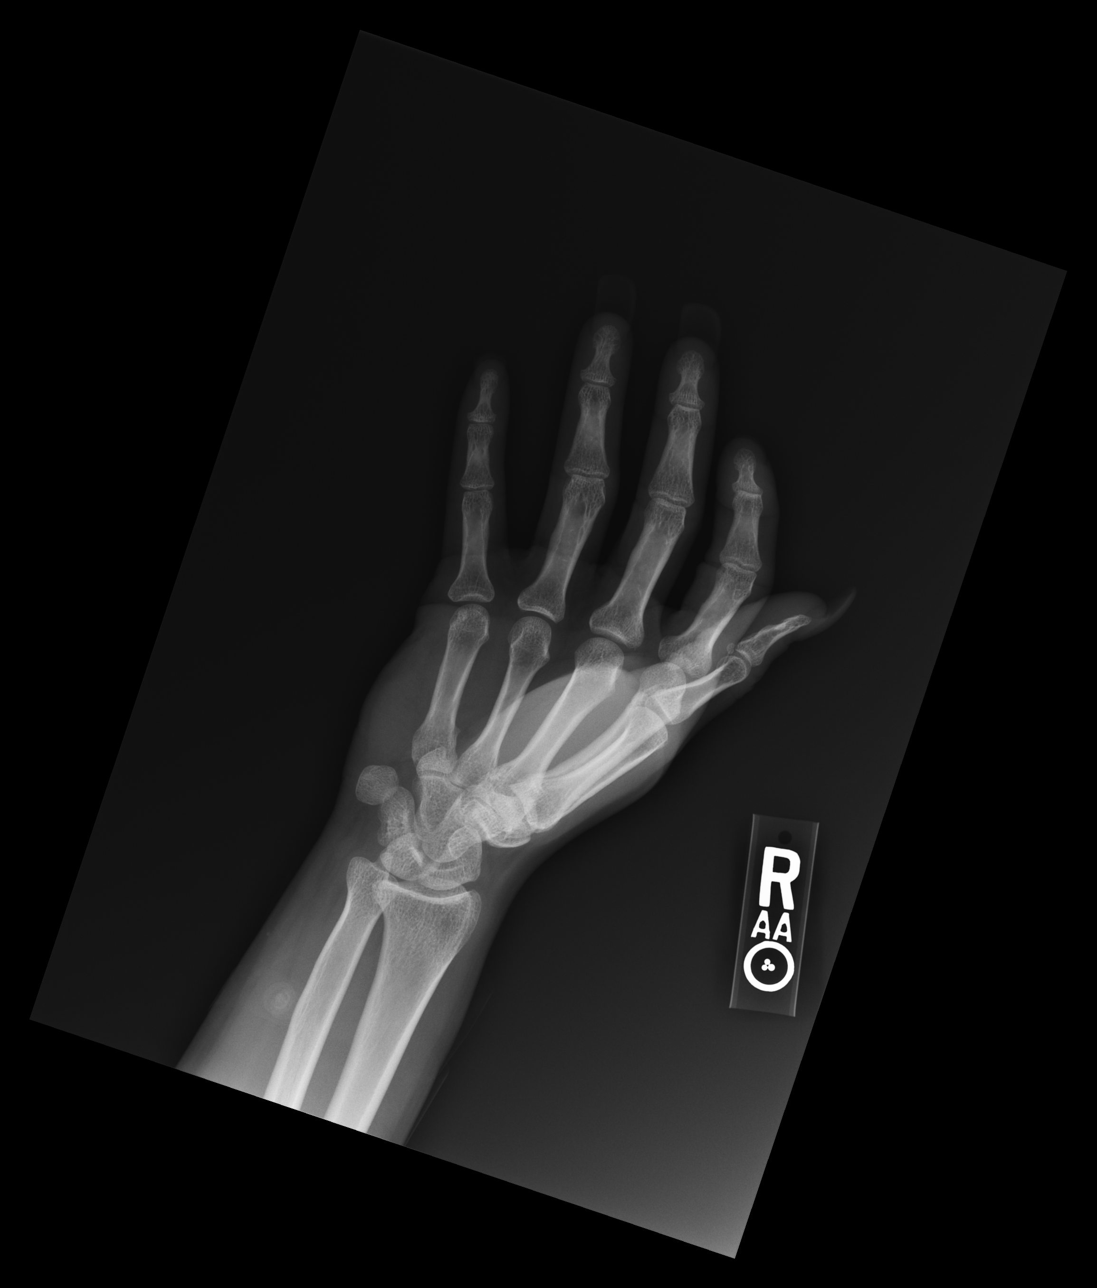

[4 of 4 positions shown; findings below may reference images not displayed]

FINDINGS: Comparison made with November 28, 2011. There is no evidence of acute                
 fracture or dislocation.
IMPRESSION: Negative.

## 2018-09-19 ENCOUNTER — Encounter: Admit: 2018-09-19 | Discharge: 2018-09-19 | Payer: BC Managed Care – PPO

## 2018-09-19 MED ORDER — DICLOFENAC SODIUM 1 % TP GEL
4 g | Freq: Four times a day (QID) | TOPICAL | 3 refills | 19.00000 days | Status: AC
Start: 2018-09-19 — End: ?

## 2018-09-23 ENCOUNTER — Encounter: Admit: 2018-09-23 | Discharge: 2018-09-23 | Payer: BC Managed Care – PPO

## 2018-10-06 ENCOUNTER — Encounter: Admit: 2018-10-06 | Discharge: 2018-10-06 | Payer: BC Managed Care – PPO

## 2018-10-06 DIAGNOSIS — G43009 Migraine without aura, not intractable, without status migrainosus: Principal | ICD-10-CM

## 2018-10-07 MED ORDER — RIZATRIPTAN 10 MG PO TAB
ORAL_TABLET | Freq: Once | 1 refills | Status: DC
Start: 2018-10-07 — End: 2018-12-17

## 2018-12-17 ENCOUNTER — Encounter: Admit: 2018-12-17 | Discharge: 2018-12-17

## 2018-12-17 DIAGNOSIS — G43009 Migraine without aura, not intractable, without status migrainosus: Secondary | ICD-10-CM

## 2018-12-17 MED ORDER — RIZATRIPTAN 10 MG PO TAB
ORAL_TABLET | Freq: Once | 1 refills | Status: DC
Start: 2018-12-17 — End: 2019-03-05

## 2019-01-15 ENCOUNTER — Encounter: Admit: 2019-01-15 | Discharge: 2019-01-15

## 2019-01-15 ENCOUNTER — Ambulatory Visit: Admit: 2019-01-15 | Discharge: 2019-01-15

## 2019-01-15 DIAGNOSIS — M25532 Pain in left wrist: Secondary | ICD-10-CM

## 2019-01-15 DIAGNOSIS — M25531 Pain in right wrist: Secondary | ICD-10-CM

## 2019-02-02 ENCOUNTER — Encounter: Admit: 2019-02-02 | Discharge: 2019-02-02

## 2019-02-03 MED ORDER — LEVOTHYROXINE 75 MCG PO TAB
ORAL_TABLET | Freq: Every day | ORAL | 0 refills | 30.00000 days | Status: DC
Start: 2019-02-03 — End: 2019-03-04

## 2019-03-04 ENCOUNTER — Encounter: Admit: 2019-03-04 | Discharge: 2019-03-04 | Payer: BC Managed Care – PPO

## 2019-03-04 MED ORDER — LEVOTHYROXINE 75 MCG PO TAB
ORAL_TABLET | Freq: Every day | ORAL | 0 refills | 30.00000 days | Status: DC
Start: 2019-03-04 — End: 2019-03-14

## 2019-03-05 ENCOUNTER — Encounter: Admit: 2019-03-05 | Discharge: 2019-03-05 | Payer: BC Managed Care – PPO

## 2019-03-05 DIAGNOSIS — G43009 Migraine without aura, not intractable, without status migrainosus: Secondary | ICD-10-CM

## 2019-03-05 MED ORDER — RIZATRIPTAN 10 MG PO TAB
ORAL_TABLET | Freq: Once | 1 refills | Status: DC
Start: 2019-03-05 — End: 2019-05-07

## 2019-03-14 ENCOUNTER — Encounter: Admit: 2019-03-14 | Discharge: 2019-03-14 | Payer: BC Managed Care – PPO

## 2019-03-14 MED ORDER — LEVOTHYROXINE 75 MCG PO TAB
ORAL_TABLET | Freq: Every day | ORAL | 0 refills | 30.00000 days | Status: DC
Start: 2019-03-14 — End: 2019-04-15

## 2019-03-17 ENCOUNTER — Ambulatory Visit: Admit: 2019-03-17 | Discharge: 2019-03-17 | Payer: BC Managed Care – PPO

## 2019-03-17 ENCOUNTER — Encounter: Admit: 2019-03-17 | Discharge: 2019-03-17 | Payer: BC Managed Care – PPO

## 2019-03-17 DIAGNOSIS — Z8619 Personal history of other infectious and parasitic diseases: Secondary | ICD-10-CM

## 2019-03-17 DIAGNOSIS — R42 Dizziness and giddiness: Secondary | ICD-10-CM

## 2019-03-17 DIAGNOSIS — F419 Anxiety disorder, unspecified: Secondary | ICD-10-CM

## 2019-03-17 DIAGNOSIS — Z8669 Personal history of other diseases of the nervous system and sense organs: Secondary | ICD-10-CM

## 2019-03-17 DIAGNOSIS — E039 Hypothyroidism, unspecified: Secondary | ICD-10-CM

## 2019-03-17 NOTE — Progress Notes
**Note Kara-Identified via Obfuscation** Date of Service: 03/17/2019    Subjective:             Kara Ponce is a 33 y.o. female.    History of Present Illness  Kara Ponce presents with a history of positional vertigo, she also has a longstanding lifelong history of migraine headaches.  She tried multiple migraine prophylactics in high school, she did not like the side effects therefore she is not continuing them.  She tends to have bouts of positional vertigo every few months, she has had a severe bout recently as well as right ear fullness with some popping or decreased ability or need to pop.  She states her hearing is okay.  She has had an MRI in the past and she has been instructed to perform Epley maneuvers at home which is helped but she still feels a sensation in her tendency to vertigo with head movements.     Review of Systems   Constitutional: Negative.    HENT: Negative.    Eyes: Negative.    Respiratory: Negative.    Cardiovascular: Negative.    Gastrointestinal: Negative.    Endocrine: Negative.    Genitourinary: Negative.    Musculoskeletal: Negative.    Skin: Negative.    Allergic/Immunologic: Negative.    Neurological: Negative.    Hematological: Negative.    Psychiatric/Behavioral: Negative.          Objective:         ? diclofenac (VOLTAREN) 1 % topical gel Apply four g topically to affected area four times daily.   ? levothyroxine (SYNTHROID) 75 mcg tablet TAKE 1 TABLET BY MOUTH EVERY DAY   ? omeprazole magnesium (PRILOSEC PO) Take  by mouth daily.   ? rizatriptan (MAXALT) 10 mg tablet TAKE 1 TABLET BY MOUTH AT ONSET OF MIGRAINE. MAY REPEAT DOSE ONCE IN 2 HOURS IF NEEDED.     Vitals:    03/17/19 1303   BP: 124/70   BP Source: Arm, Right Upper   Patient Position: Sitting   Pulse: 76   Temp: 36.8 ?C (98.2 ?F)   Weight: 59 kg (130 lb)   Height: 160 cm (63)   PainSc: Zero     Body mass index is 23.03 kg/m?Marland Kitchen     Physical Exam  Exam today reveals normal eardrums and cranial nerves.  Her right Hallpike maneuver was notable for sensation of vertigo with the right head down.  Right Epley maneuver was performed today.    Neat Appearance: Yes  Oral Communication: Yes  Clear Voice: Yes      Neuro: Left: Right:   CN VII 1 / 6 1 / 6     CN 3, 4, 5, 6 normal normal     CN 9, 10, 11, 12 normal normal           Ext Nose lesion N Nasopharynx lesion N Face Lesions N Spont Nyst N   Int Nose lesion N Neck Lesions N Sinus Tenderness N Gaze Nystagmus N   Lip/Teeth/Gum Lesion N Thyroid Lesions N Salivary Gland Lesions N Affect Abnormal N   Oropharynx Lesion N Lymphadenopathy N Edema Extremities N Orientation Abn N   Indirect Laryngoscopy abnl? n Abdominal Tenderness N Noticeable Extremity changes? N Unusual chest expansion with respirations? N                   Testing:    Procedure:  Canalith Repositioning Maneuver    Kara Ponce had a positive right Hallpike test with  delayed vertigo noted.    Because of this a right Epley maneuver was performed with the patient in each position for 30-60 seconds.She did not have nystagmus with each change of position.      The patient is instructed to sleep upright at least 30 degrees for the next 3 nights.           Assessment and Plan:  Kara Ponce presents with a history of positional vertigo, she also has a longstanding lifelong history of migraine headaches.  She tried multiple migraine prophylactics in high school, she did not like the side effects therefore she is not continuing them.  She tends to have bouts of positional vertigo every few months, she has had a severe bout recently as well as right ear fullness with some popping or decreased ability or need to pop.  She states her hearing is okay.  She has had an MRI in the past and she has been instructed to perform Epley maneuvers at home which is helped but she still feels a sensation in her tendency to vertigo with head movements.    Exam today reveals normal eardrums and cranial nerves.  Her right Hallpike maneuver was notable for sensation of vertigo with the right head down.  Right Epley maneuver was performed today.    I would recommend physical therapy for canalith repositioning's, I will send a referral into the spine center for what our vestibular therapist to help out.  I think a good relationship between them would be very helpful.

## 2019-04-15 ENCOUNTER — Encounter: Admit: 2019-04-15 | Discharge: 2019-04-15 | Payer: BC Managed Care – PPO

## 2019-04-15 MED ORDER — LEVOTHYROXINE 75 MCG PO TAB
ORAL_TABLET | Freq: Every day | ORAL | 0 refills | 30.00000 days | Status: DC
Start: 2019-04-15 — End: 2019-05-13

## 2019-04-29 ENCOUNTER — Encounter: Admit: 2019-04-29 | Discharge: 2019-04-30 | Payer: BC Managed Care – PPO

## 2019-04-29 ENCOUNTER — Encounter: Admit: 2019-04-29 | Discharge: 2019-04-29 | Payer: BC Managed Care – PPO

## 2019-04-29 DIAGNOSIS — R05 Cough: Secondary | ICD-10-CM

## 2019-04-29 DIAGNOSIS — R0602 Shortness of breath: Secondary | ICD-10-CM

## 2019-04-29 DIAGNOSIS — R0989 Other specified symptoms and signs involving the circulatory and respiratory systems: Secondary | ICD-10-CM

## 2019-04-29 DIAGNOSIS — J029 Acute pharyngitis, unspecified: Secondary | ICD-10-CM

## 2019-04-29 DIAGNOSIS — Z20828 Contact with and (suspected) exposure to other viral communicable diseases: Secondary | ICD-10-CM

## 2019-04-29 NOTE — Patient Instructions
Coronavirus Disease 2019 (COVID-19): Overview  Coronavirus disease 2019 (COVID-19) is a respiratory illness. It's caused by a new (novel) coronavirus. There are many types of coronavirus. Coronaviruses are a very common cause of colds and bronchitis. They may sometimes cause lung infection (pneumonia). Symptoms can range from mild to severe. Some people have no symptoms.?These viruses are also found in some animals.   All 50 states in the [place] have reported cases of COVID-19. Most states report community spread of COVID-19. This means the source of the illness is not known.?COVID-19 is a rapidly-emerging infectious disease. This means that scientists are actively researching it.?There are information updates regularly.   Public health officials are working to find the source. How the virus spreads is not yet fully understood, but it seems to spread and infect people fairly easily. Some people who have been infected in an area may not be sure how or where they were infected. The virus may be spread through droplets of fluid that a person coughs or sneezes into the air. It may be spread if you touch a surface with the virus on it, such as a handle or object, and then touch your eyes, nose, or mouth.   For the latest information, visit the CDC website at www.cdc.gov/coronavirus/2019-ncov. Or call 800-CDC-INFO (800-232-4636).     What are the symptoms of COVID-19?  Some people have no symptoms or mild symptoms. Symptoms can also vary from person to person. As experts learn more about COVID-19, other symptoms are being reported. Symptoms may appear 2 to 14 days after contact with the virus:   ? Fever or chills  ? Coughing  ? Trouble breathing or feeling short of breath  ? Sore throat  ? Stuffy or runny nose  ? Headache and body aches  ? Fatigue  ? Nausea, vomiting, diarrhea, or abdominal pain  ? New loss of sense of smell or taste  You can check your symptoms with the CDC?s Coronavirus Self-Checker. What are possible complications from COVID-19?  In many cases, this virus can cause infection (pneumonia) in both lungs. In some cases, this can cause death. Certain people are at higher risk for complications. This includes older adults and people with serious chronic health conditions such as heart or lung disease, diabetes, or kidney disease. It includes people with health conditions that suppress the immune system. And it includes people taking medicines that suppress the immune system.   As experts learn more about COVID-19, other complications are being reported that may be linked to COVID-19. Rarely, some children have developed severe complications called multisystem inflammatory syndrome in children (MIS-C). MIS-C seems to be similar to [place] disease, a rare condition causing inflammation of blood vessels and body organs. It's not yet known if MIS-C happens only in children, or if adults are also at risk. It's also not known if it's related to COVID-19, because many children, but not all, have tested positive for the virus. Experts continue to study MIS-C. The CDC advises healthcare providers to report to local health departments any person under age 21 years old who is ill enough to be in the hospital and has all of the following:   ? A fever over 100.4?F (38.0?C) for more than 24 hours and a positive SARS-CoV-2 test or exposure to the virus in the last 4 weeks   ? Inflammation in at least 2 organs such as the heart, lungs, or kidneys with lab tests that show inflammation   ? No other diagnoses besides COVID-19   explain the child's symptoms     How is COVID-19 diagnosed? Your healthcare provider will ask about your symptoms. He or she will ask where you live, and about your recent travel, and any contact with sick people. If your healthcare provider thinks you may have COVID-19, he or she will consider whether to test you for COVID-19. This depends on the availability of testing in your area, and how sick you are. Follow all instructions from your healthcare provider. Guidelines for testing may change as more information about the virus becomes available. Currently, COVID-19 is diagnosed by:   ? Viral test.  Viral tests tell if you have a current COVID-19 infection. A nose-throat swab may be wiped inside your nose to the back of your throat. Or a sample of your saliva may be taken. Either of these samples will be checked for the SARS-CoV-2 virus. Availability of tests vary. Some test kits can be done at home. But both nose-throat swabs and saliva tests must be sent to a lab to be looked at.   If your healthcare provider thinks or confirms that you have COVID-19, you may have other tests. These tests may include:   ? Antibody blood test.  Antibody tests are being looked at to find out if a person has previously been infected with the virus and may now have antibodies such as SARS AB IgG in their blood to give some immunity. The accuracy and availability of antibody tests vary. An antibody test may not be able to show if you have a current infection because it can take up to a few weeks after infection to make antibodies. It's not yet known how long immunity lasts after being infected with the virus.   ? Sputum culture.  A small sample of mucus coughed from your lungs (sputum) may be collected if you have a moist cough. It may be checked for the virus or to look for pneumonia.   ? Imaging tests.  You may have a chest X-ray or CT scan.       Note about reinfection and your immunity At this time, it's unclear if people can be reinfected with COVID-19. The CDC notes that if a person has fully recovered from COVID-19 and is retested within 3 months of the first infection, they may continue to have low levels of the virus in their body and test positive for COVID-19, even though they are not spreading COVID-19. Having a positive COVID-19 test after an infection doesn't mean you can't be reinfected. It's not yet known how long immunity lasts after being infected with the virus.   How is COVID-19 treated?  There is currently no medicine proven to prevent or treat the virus. Some experimental medicines are being tested for COVID-19. Other medicines used to treat other conditions are being looked at for COVID-19, but these are not currently approved to treat it.   The most proven treatments right now are those to help your body while it fights the virus. This is known as supportive care. Supportive care may include:   ? Getting rest.  This helps your body fight the illness.   ? Staying hydrated.  Drinking liquids is the best way to prevent dehydration.. Try to drink 6 to 8 glasses of liquids every day, or as advised by your provider. Also check with your provider about which fluids are best for you. Don't drink fluids that contain caffeine or alcohol.   ? Taking over-the-counter (OTC)?pain medicine.  These are used  to help ease pain and reduce fever. Follow your healthcare provider's instructions for which OTC medicine to use.   For severe illness, you may need to stay in the hospital. Care during severe illness may include:   ? IV (intravenous) fluids.  These are given through a vein to help keep your body hydrated.   ? Oxygen. You may be given supplemental oxygen or ventilation with a breathing machine (ventilator). This is done so you get enough oxygen in your body. ? Prone positioning.  Depending on how sick you are during your hospital stay, your healthcare team may turn you regularly on your stomach. This is called prone positioning. It helps increase the amount of oxygen you get to your lungs. Follow your healthcare team's instructions on position changes while you're in the hospital. Also follow their discharge advice on the best positions to help your breathing once you go home.   People who have had COVID-19 and are fully recovered may be asked by their healthcare team to consider donating plasma. This is called COVID-19 convalescent plasma donation. Plasma from people fully recovered from COVID-19 may contain antibodies to help fight COVID-19 in people who are currently seriously ill with the disease. Experts don't know if the donated plasma will work well as a treatment. Research continues, and the FDA has approved it for emergency use in certain people with serious or life-threatening COVID-19. Talk with your provider to learn more about convalescent plasma donation and whether you qualify to donate.       Are you at risk for COVID-19?  You are at risk for COVID-19 if you have had close contact with someone with the virus, or if you live in or traveled to an area with cases of it. Close contact means being within about 6 feet of someone, or living in the same house or visiting a person who has or may have COVID-19. Some recent studies suggest that COVID-19 may be spread by people who are not showing symptoms.     Preventing the Spread of Infection    ?     Hand Hygiene    The best way to prevent the spread of infections is to wash your hands or use hand sanitizer. Staff will clean hands between tasks and upon entering and exiting your hospital room. ?   For patients and visitors:    Clean hands frequently, upon entering and exiting a room, and after coughing and sneezing.    When washing hands with soap and water:   ? Wet hands with warm water  ? Apply soap ? Lather soap by rubbing hands together for 20 seconds, covering all surfaces of hands and fingers  ? Rinse hands thoroughly  ? Dry hands with paper towel  ? Use a towel to turn off faucet  ?  When cleaning hands with hand sanitizer:   ? Apply hand sanitizer to hands  ? Rub on hands covering of hands and fingers until dry (about 15 to 20 seconds) ?      Coronavirus Disease 2019 (COVID-19): Caring for Yourself or Others   If you or a household member have symptoms of COVID-19, follow the guidelines below for preventing spread of the virus, and managing symptoms.     If you think you have COVID-19 symptoms  ? Stay home. Call your healthcare provider and tell them you have symptoms of COVID-19. Do this before going to any hospital or clinic. Follow your provider's instructions. You may be advised to  isolate yourself at home. This is called self-isolation.   ? Don?t panic. Keep in mind that other illnesses can cause similar symptoms.   ? Stay away from work, school, and public places. Limit physical contact with family members. Limit visitors. Don't kiss anyone or share eating or drinking utensils. Clean surfaces you touch with disinfectant. This is to help prevent the virus from spreading.   ? If you need to cough or sneeze, do it into a tissue. Then throw the tissue into the trash. If you don't have tissues, cough or sneeze into the bend of your elbow.   ? Don?t share food or personal items with people in your household. This includes items like eating and drinking utensils, towels, and bedding.   ? Wear a cloth face mask around other people. During a public health emergency, medical face masks may be reserved for healthcare workers. You may need to make a cloth face mask of your own. You can do this using a bandana, T-shirt, or other cloth. The CDC has instructions on how to make a face mask. Wear the mask so that it covers both your nose and mouth. ? If you need to go to a hospital or clinic, expect that the healthcare staff will wear protective equipment such as masks, gowns, gloves, and eye protection. You may be put in a separate room. This is to prevent the possible virus from spreading.   ? Tell the healthcare staff about recent travel. This includes local travel on public transport. Staff may need to find other people you have been in contact with.   ? Follow all instructions the healthcare staff give you.    If you have been diagnosed with COVID-19  ? Stay home and start self-isolation. Don?t leave your home unless you need to get medical care. Don't go to work, school, or public areas. Don't use public transportation or taxis.   ? Follow all instructions from your healthcare provider. Call your healthcare provider?s office before going. They can prepare and give you instructions. This will help prevent the virus from spreading.   ? If you need to go to a hospital or clinic, expect that the healthcare staff will wear protective equipment such as masks, gowns, gloves, and eye protection. You may be put in a separate room. This is to prevent the possible virus from spreading.   ? Wear a face mask. This is to protect other people from your germs. If you are not able to wear a mask, your caregivers should. During a public health emergency, medical face masks may be reserved for healthcare workers. You may need to make a cloth face mask of your own. You can do this using a bandana, T-shirt, or other cloth. The CDC has instructions on how to make a face mask. Wear the mask so that it covers both your nose and mouth.   ? Stay away from other people in your home.  ? Have no contact with pets and animals.  ? Don?t share food or personal items with people in your household. This includes items like eating and drinking utensils, towels, and bedding. ? If you need to cough or sneeze, do it into a tissue. Then throw the tissue into the trash. If you don't have tissues, cough or sneeze into the bend of your elbow.   ? Wash your hands often.    Self-care at home?  There is currently no medicine approved to prevent or treat the virus. Some experimental and  other medicines are being tested against COVID-19. Other medicines used to treat other conditions are being looked at for COVID-19, but they are not currently approved to treat it.   Current treatment is mainly aimed at helping your body while it fights the virus. This is known as supportive care. Take care of yourself at home by:   ? Getting rest. This helps your body fight the illness.   ? Staying hydrated.  Drinking liquids is the best way to prevent dehydration. Try to drink 6 to 8 glasses of liquids every day, or as advised by your provider. Also check with your provider about which fluids are best for you. Don't drink fluids that contain caffeine or alcohol.   ? Taking over-the-counter (OTC) pain medicine. These are used to help ease pain and reduce fever. Follow your healthcare provider's instructions for which OTC medicine to use.   If you've been in the hospital for suspected or confirmed COVID-19 and now are home, follow all of your healthcare team's instructions. This will include when it's OK to stop self-isolation. You may also get instructions on position changes to help your breathing, such as lying on your belly (prone positioning). If you've had confirmed COVID-19, your healthcare team may ask you to consider donating your plasma. This is called COVID-19 convalescent plasma donation. Plasma from people fully recovered from COVID-19 may contain antibodies to help fight COVID-19 in people who are currently seriously ill with the disease. Experts don't know the safety of COVID-19 convalescent plasma or how well it works. Research continues. The FDA has approved it for emergency use in certain people with serious or life-threatening COVID-19.     Caring for a sick person?  ? Follow all instructions from healthcare staff.  ? Wash your hands often.  ? Wear protective clothing as advised.  ? Make sure the sick person wears a mask. If they can't wear a mask, don't stay in the same room with the person. If you must be in the same room, wear a face mask. When wearing a mask, make sure that it covers both the nose and mouth.   ? Keep track of the sick person?s symptoms.  ? Clean home surfaces often with disinfectant. This includes phones, kitchen counters, fridge door handle, bathroom surfaces, and others.   ? Don?t let anyone share household items with the sick person. This includes eating and drinking tools, towels, sheets, or blankets.   ? Clean fabrics and laundry thoroughly.  ? Keep other people and pets away from the sick person.    When you can stop self-isolation  When you are sick with COVID-19, you should stay away from other people. This is called self-isolation. Your limits are different if you've had COVID-19 in the last 3 months but are fully recovered without symptoms and you have been exposed to someone with COVID-19. If you are symptom-free, you don't need to stay home away from others or be retested. The CDC doesn't recommend retesting unless you have symptoms of COVID-19 and your new symptoms can't be linked to another illness. Contact your healthcare provider if you have any questions. If you develop symptoms, stay home. If you had COVID-19 over 3 months ago and have been exposed again, treat it like you've never had COVID-19 and stay home, limit your contact with others, call your provider, and monitor for symptoms.   If you are normally healthy, the CDC does not advise retesting for COVID-19 with nose-throat swabs. You can stop self-isolation  when all 3 of these are true:   1. You have had no fever for at least 24 hours. This means no fever without medicine that reduces fever, such as acetaminophen, for at least 24 hours.   2. Your symptoms such as cough or trouble breathing have improved.   3. It has been at least 10 days since your first symptoms started.   Talk with your healthcare provider before you leave home. Tell them if the 3 things above are true for you. They may tell you it?s OK to leave home. In some cases, your state or local area may have specific advice. Your healthcare provider will tell you more.?   If you have a weak immune system and COVID-19, or if you've had severe COVID-19,  your instructions on when to stop isolation will be somewhat different. Some conditions and treatments can cause a weak immune system. These include cancer treatment, bone marrow or organ transplants, and conditions such as HIV or other immune system disorders. You may be advised to stay home from 10 days to 20 days after your symptoms first started. Your healthcare provider may want to retest you for COVID-19. Follow your provider's instructions. When you return to public settings  When you are well enough to go outside your home, consider the CDC's guidance on cloth face masks:   ? The CDC advises all people over age 30 to wear cloth face masks in public settings when around people outside of their household, especially when it's hard to socially distance. For example, wear a face mask in populated places such as public transit, public protests and marches, and crowded stores, bars, and restaurants.   ? Cloth masks may help prevent people who have COVID-19 form spreading the virus to others.   ? Cloth masks are most likely to reduce COVID-19 spread when masks are widely used by people who are out in the public.   Certain people should not wear a face covering. This includes:   ? Children younger than 80 years old  ? Anyone with a health, developmental, or mental health condition that can be made worse by wearing a mask   ? Anyone who is unconscious or unable to remove the face covering without help. See the CDC's guidance on who should not wear a face mask.     When to call your healthcare provider  Call your healthcare provider right away if a sick person has any of these:   ? Trouble breathing  ? Pain or pressure in chest  If a sick person has any of these, call 911:  ? Trouble breathing that gets worse  ? Pain or pressure in chest that gets worse  ? Blue tint to lips or face  ? Fast or irregular heartbeat  ? Confusion or trouble waking  ? Fainting or loss of consciousness  ? Coughing up blood    Going home from the hospital   If you were diagnosed with COVID-19 and were recently discharged from the hospital:   ? Follow the instructions above for self-care and isolation.  ? Follow the hospital healthcare team?s specific instructions.   ? Ask questions if anything is unclear to you. Write down answers so you remember them.

## 2019-04-30 ENCOUNTER — Encounter: Admit: 2019-04-30 | Discharge: 2019-04-30 | Payer: BC Managed Care – PPO

## 2019-05-07 ENCOUNTER — Encounter: Admit: 2019-05-07 | Discharge: 2019-05-07 | Payer: BC Managed Care – PPO

## 2019-05-07 DIAGNOSIS — G43009 Migraine without aura, not intractable, without status migrainosus: Secondary | ICD-10-CM

## 2019-05-07 MED ORDER — RIZATRIPTAN 10 MG PO TAB
ORAL_TABLET | Freq: Once | 0 refills | Status: DC
Start: 2019-05-07 — End: 2019-06-26

## 2019-05-10 ENCOUNTER — Encounter: Admit: 2019-05-10 | Discharge: 2019-05-10 | Payer: BC Managed Care – PPO

## 2019-05-12 ENCOUNTER — Encounter: Admit: 2019-05-12 | Discharge: 2019-05-12 | Payer: BC Managed Care – PPO

## 2019-05-12 DIAGNOSIS — Z0000 Encounter for general adult medical examination without abnormal findings: Secondary | ICD-10-CM

## 2019-05-12 DIAGNOSIS — E039 Hypothyroidism, unspecified: Secondary | ICD-10-CM

## 2019-05-13 MED ORDER — LEVOTHYROXINE 75 MCG PO TAB
ORAL_TABLET | Freq: Every day | ORAL | 0 refills | 30.00000 days | Status: DC
Start: 2019-05-13 — End: 2019-06-26

## 2019-06-16 ENCOUNTER — Ambulatory Visit: Admit: 2019-06-16 | Discharge: 2019-06-16 | Payer: BC Managed Care – PPO

## 2019-06-16 ENCOUNTER — Encounter: Admit: 2019-06-16 | Discharge: 2019-06-16 | Payer: BC Managed Care – PPO

## 2019-06-16 DIAGNOSIS — E039 Hypothyroidism, unspecified: Secondary | ICD-10-CM

## 2019-06-16 LAB — CBC AND DIFF
Lab: 0 % (ref >60)
Lab: 0 10*3/uL (ref 0–0.20)
Lab: 0.1 10*3/uL (ref 0–0.45)
Lab: 0.3 10*3/uL (ref 0–0.80)
Lab: 1.1 10*3/uL (ref 1.0–4.8)
Lab: 1.7 10*3/uL — ABNORMAL LOW (ref 1.8–7.0)
Lab: 3.3 10*3/uL — ABNORMAL LOW (ref 4.5–11.0)
Lab: 34 % — ABNORMAL LOW (ref 24–44)
Lab: 4 % (ref >60)
Lab: 4.7 M/UL (ref 4.0–5.0)
Lab: 53 % (ref 41–77)
Lab: 8.1 FL (ref 7–11)
Lab: 9 % (ref 4–12)

## 2019-06-16 LAB — THYROID STIMULATING HORMONE-TSH: Lab: 1.2 uU/mL (ref >40)

## 2019-06-16 LAB — LIPID PROFILE
Lab: 101 mg/dL — ABNORMAL HIGH (ref <100)
Lab: 110 mg/dL (ref 11–15)
Lab: 172 mg/dL (ref <200)
Lab: 40 mg/dL (ref <150)
Lab: 8 mg/dL (ref 32.0–36.0)

## 2019-06-16 LAB — COMPREHENSIVE METABOLIC PANEL
Lab: 140 MMOL/L (ref 137–147)
Lab: 3.7 MMOL/L (ref 3.5–5.1)

## 2019-06-24 ENCOUNTER — Ambulatory Visit: Admit: 2019-06-24 | Discharge: 2019-06-24 | Payer: BC Managed Care – PPO

## 2019-06-24 ENCOUNTER — Encounter: Admit: 2019-06-24 | Discharge: 2019-06-24 | Payer: BC Managed Care – PPO

## 2019-06-24 DIAGNOSIS — S83281A Other tear of lateral meniscus, current injury, right knee, initial encounter: Secondary | ICD-10-CM

## 2019-06-24 DIAGNOSIS — S8991XA Unspecified injury of right lower leg, initial encounter: Secondary | ICD-10-CM

## 2019-06-24 NOTE — Progress Notes
Chief complaint: Right knee pain, catching and popping.    HPI:  34 year old female complains of right-sided knee pain that has been on and off for several years.  She states that she got a Peloton bike about 6 weeks ago.  She has had a significant increase in this pain.  She complains of popping especially laterally.  She complains of pain while ambulating as well as at rest.  She feels as though it is locking in place.  She denies any medial pain.  She denies instability.    Physical exam:  Examination right knee demonstrates full symmetric range of motion.  No effusion is noted today.  She is tender to palpation of the lateral joint line.  McMurray's test is positive for the lateral compartment.  Medial joint line is nontender to palpation.  Medial McMurray's test is negative.  Her knee is stable to ligamentous exam in all planes.  No patellofemoral crepitus is noted.  Patella tracks well.  Patellar apprehension testing is negative.    Left knee exam is benign.    Imaging:  3 views of her right knee were obtained today.  Joint spaces are well-maintained.  No fracture or loose bodies noted.    Impression: Right knee pain concern for lateral meniscus tear.    Plan:  We discussed her complaints, physical exam, and imaging findings today.  Due to her mechanical symptoms and the chronicity of her symptoms I ordered an MRI to further evaluate for meniscal tear.  Once results available for review we will give her a call and discuss a treatment plan moving forward.     Review Of Symptoms:  A 14-point review of systems was performed and was positive as below and otherwise negative:  Review of Systems    Allergies:  Sulfa (sulfonamide antibiotics)    Current Medications:  ? diclofenac (VOLTAREN) 1 % topical gel Apply four g topically to affected area four times daily.   ? levothyroxine (SYNTHROID) 75 mcg tablet TAKE 1 TABLET BY MOUTH EVERY DAY   ? omeprazole magnesium (PRILOSEC PO) Take  by mouth daily. ? rizatriptan (MAXALT) 10 mg tablet TAKE 1 TABLET BY MOUTH AT ONSET OF MIGRAINE. MAY REPEAT DOSE ONCE IN 2 HOURS IF NEEDED.       Past Medical History:  Medical History:   Diagnosis Date   ? Anxiety    ? History of chicken pox    ? History of migraine headaches    ? Hypothyroidism        Past Surgical History:  Surgical History:   Procedure Laterality Date   ? HX WISDOM TEETH EXTRACTION  2005   ? CYSTOSCOPY  08/13/2009    NORMAL   ? rectal nodule N/A 12/26/2017    Performed by Tempie Hoist, DO at [facility]   ? Right cubital tunnel release, possible transposition Right 01/14/2018    Performed by Desiree Hane, MD at [facility] OR   ? ANORECTAL MANOMETRY N/A 02/15/2018    Performed by Eliott Nine, MD at [facility]       Social History:  Social History     Tobacco Use   Smoking Status Never Smoker   Smokeless Tobacco Never Used     Social History     Substance and Sexual Activity   Drug Use No     Social History     Substance and Sexual Activity   Alcohol Use No   ? Alcohol/week: 1.0 - 2.0 standard drinks   ?  Types: 1 - 2 Standard drinks or equivalent per week       Family History:  Family History   Problem Relation Age of Onset   ? Hypertension Mother    ? Thyroid Disease Mother    ? Heart Disease Mother    ? Hypertension Father    ? Stroke Father    ? Hearing Loss Father    ? High Cholesterol Father    ? Diabetes Maternal Grandfather    ? Heart Disease Paternal Grandmother    ? Heart Disease Paternal Grandfather    ? Anesthetic Complication Neg Hx    ? Cancer-Breast Neg Hx    ? Cancer-Colon Neg Hx    ? Cancer-Ovarian Neg Hx    ? Cancer-Uterine Neg Hx    ? Bleeding Disorders Neg Hx    ? VTE Neg Hx    ? Cancer Neg Hx        Vitals:  There were no vitals filed for this visit.  There is no height or weight on file to calculate BMI.     ATTESTATION    I personally performed the E/M including history, physical exam, and MDM.    Staff name:  Yong Channel, MD Date:  06/24/2019          Yong Channel, MD Portions of this noted may have been created using Dragon, a voice recognition software.  Please contact my office for any clarification of documentation.    Please send a copy of office notes to the primary care physician and referring providers.

## 2019-06-25 ENCOUNTER — Encounter: Admit: 2019-06-25 | Discharge: 2019-06-25 | Payer: BC Managed Care – PPO

## 2019-06-25 DIAGNOSIS — G43009 Migraine without aura, not intractable, without status migrainosus: Secondary | ICD-10-CM

## 2019-06-26 MED ORDER — LEVOTHYROXINE 75 MCG PO TAB
ORAL_TABLET | Freq: Every day | ORAL | 0 refills | 30.00000 days | Status: DC
Start: 2019-06-26 — End: 2019-09-15

## 2019-06-26 MED ORDER — RIZATRIPTAN 10 MG PO TAB
ORAL_TABLET | Freq: Once | 0 refills | Status: DC
Start: 2019-06-26 — End: 2019-07-25

## 2019-06-27 ENCOUNTER — Encounter: Admit: 2019-06-27 | Discharge: 2019-06-27 | Payer: BC Managed Care – PPO

## 2019-06-27 ENCOUNTER — Ambulatory Visit: Admit: 2019-06-27 | Discharge: 2019-06-27 | Payer: BC Managed Care – PPO

## 2019-06-27 DIAGNOSIS — S83281A Other tear of lateral meniscus, current injury, right knee, initial encounter: Secondary | ICD-10-CM

## 2019-06-27 DIAGNOSIS — S8991XA Unspecified injury of right lower leg, initial encounter: Secondary | ICD-10-CM

## 2019-07-25 ENCOUNTER — Encounter: Admit: 2019-07-25 | Discharge: 2019-07-25 | Payer: BC Managed Care – PPO

## 2019-07-25 DIAGNOSIS — G43009 Migraine without aura, not intractable, without status migrainosus: Secondary | ICD-10-CM

## 2019-07-25 MED ORDER — RIZATRIPTAN 10 MG PO TAB
ORAL_TABLET | Freq: Once | 0 refills | Status: DC
Start: 2019-07-25 — End: 2019-09-15

## 2019-08-06 ENCOUNTER — Encounter: Admit: 2019-08-06 | Discharge: 2019-08-06 | Payer: BC Managed Care – PPO

## 2019-09-15 MED ORDER — LEVOTHYROXINE 75 MCG PO TAB
ORAL_TABLET | Freq: Every day | ORAL | 0 refills | 30.00000 days | Status: DC
Start: 2019-09-15 — End: 2019-10-24

## 2019-09-15 MED ORDER — RIZATRIPTAN 10 MG PO TAB
ORAL_TABLET | Freq: Once | 0 refills | Status: DC
Start: 2019-09-15 — End: 2019-10-24

## 2019-09-24 ENCOUNTER — Ambulatory Visit: Admit: 2019-09-24 | Discharge: 2019-09-24 | Payer: BC Managed Care – PPO

## 2019-09-24 ENCOUNTER — Encounter: Admit: 2019-09-24 | Discharge: 2019-09-24 | Payer: BC Managed Care – PPO

## 2019-09-24 DIAGNOSIS — M67441 Ganglion, right hand: Secondary | ICD-10-CM

## 2019-09-24 DIAGNOSIS — F419 Anxiety disorder, unspecified: Secondary | ICD-10-CM

## 2019-09-24 DIAGNOSIS — Z8669 Personal history of other diseases of the nervous system and sense organs: Secondary | ICD-10-CM

## 2019-09-24 DIAGNOSIS — Z8619 Personal history of other infectious and parasitic diseases: Secondary | ICD-10-CM

## 2019-09-24 DIAGNOSIS — E039 Hypothyroidism, unspecified: Secondary | ICD-10-CM

## 2019-09-25 MED ORDER — LIDOCAINE (PF) 10 MG/ML (1 %) IJ SOLN
4 mL | Freq: Once | INTRAMUSCULAR | 0 refills | Status: CP | PRN
Start: 2019-09-25 — End: ?

## 2019-10-24 ENCOUNTER — Encounter: Admit: 2019-10-24 | Discharge: 2019-10-24 | Payer: BC Managed Care – PPO

## 2019-10-24 ENCOUNTER — Ambulatory Visit: Admit: 2019-10-24 | Discharge: 2019-10-24 | Payer: BC Managed Care – PPO

## 2019-10-24 DIAGNOSIS — E039 Hypothyroidism, unspecified: Secondary | ICD-10-CM

## 2019-10-24 DIAGNOSIS — Z8669 Personal history of other diseases of the nervous system and sense organs: Secondary | ICD-10-CM

## 2019-10-24 DIAGNOSIS — Z8619 Personal history of other infectious and parasitic diseases: Secondary | ICD-10-CM

## 2019-10-24 DIAGNOSIS — F419 Anxiety disorder, unspecified: Secondary | ICD-10-CM

## 2019-10-24 DIAGNOSIS — G43009 Migraine without aura, not intractable, without status migrainosus: Secondary | ICD-10-CM

## 2019-10-24 MED ORDER — RIZATRIPTAN 10 MG PO TAB
ORAL_TABLET | 1 refills | Status: DC
Start: 2019-10-24 — End: 2019-10-24

## 2019-10-24 MED ORDER — SERTRALINE 50 MG PO TAB
50 mg | ORAL_TABLET | Freq: Every day | ORAL | 3 refills | Status: AC
Start: 2019-10-24 — End: ?
  Filled 2019-10-26: qty 90, 90d supply, fill #1

## 2019-10-24 MED ORDER — LEVOTHYROXINE 75 MCG PO TAB
75 ug | ORAL_TABLET | Freq: Every day | ORAL | 3 refills | 30.00000 days | Status: AC
Start: 2019-10-24 — End: ?
  Filled 2019-10-26: qty 90, 90d supply, fill #1

## 2019-10-24 MED ORDER — RIZATRIPTAN 10 MG PO TAB
ORAL_TABLET | 0 refills | Status: DC
Start: 2019-10-24 — End: 2019-11-17
  Filled 2019-10-27: qty 10, 17d supply, fill #1

## 2019-10-25 ENCOUNTER — Encounter: Admit: 2019-10-25 | Discharge: 2019-10-25 | Payer: BC Managed Care – PPO

## 2019-10-26 ENCOUNTER — Encounter: Admit: 2019-10-26 | Discharge: 2019-10-26 | Payer: BC Managed Care – PPO

## 2019-10-27 ENCOUNTER — Encounter: Admit: 2019-10-27 | Discharge: 2019-10-27 | Payer: BC Managed Care – PPO

## 2019-10-28 ENCOUNTER — Encounter: Admit: 2019-10-28 | Discharge: 2019-10-28 | Payer: BC Managed Care – PPO

## 2019-10-28 DIAGNOSIS — Z8669 Personal history of other diseases of the nervous system and sense organs: Secondary | ICD-10-CM

## 2019-10-28 DIAGNOSIS — F419 Anxiety disorder, unspecified: Secondary | ICD-10-CM

## 2019-10-28 DIAGNOSIS — Z8619 Personal history of other infectious and parasitic diseases: Secondary | ICD-10-CM

## 2019-10-28 DIAGNOSIS — E039 Hypothyroidism, unspecified: Secondary | ICD-10-CM

## 2019-11-15 ENCOUNTER — Encounter: Admit: 2019-11-15 | Discharge: 2019-11-15 | Payer: BC Managed Care – PPO

## 2019-11-15 DIAGNOSIS — G43009 Migraine without aura, not intractable, without status migrainosus: Secondary | ICD-10-CM

## 2019-11-17 ENCOUNTER — Encounter: Admit: 2019-11-17 | Discharge: 2019-11-17 | Payer: BC Managed Care – PPO

## 2019-11-17 MED ORDER — RIZATRIPTAN 10 MG PO TAB
ORAL_TABLET | 0 refills | Status: DC
Start: 2019-11-17 — End: 2019-12-22
  Filled 2019-11-18: qty 10, 20d supply, fill #1

## 2019-11-18 ENCOUNTER — Encounter: Admit: 2019-11-18 | Discharge: 2019-11-18 | Payer: BC Managed Care – PPO

## 2019-12-19 ENCOUNTER — Encounter: Admit: 2019-12-19 | Discharge: 2019-12-19 | Payer: BC Managed Care – PPO

## 2019-12-19 ENCOUNTER — Ambulatory Visit: Admit: 2019-12-19 | Discharge: 2019-12-20 | Payer: BC Managed Care – PPO

## 2019-12-19 DIAGNOSIS — Z8619 Personal history of other infectious and parasitic diseases: Secondary | ICD-10-CM

## 2019-12-19 DIAGNOSIS — R350 Frequency of micturition: Principal | ICD-10-CM

## 2019-12-19 DIAGNOSIS — F419 Anxiety disorder, unspecified: Secondary | ICD-10-CM

## 2019-12-19 DIAGNOSIS — E039 Hypothyroidism, unspecified: Secondary | ICD-10-CM

## 2019-12-19 DIAGNOSIS — Z8669 Personal history of other diseases of the nervous system and sense organs: Secondary | ICD-10-CM

## 2019-12-19 MED ORDER — CIPROFLOXACIN HCL 250 MG PO TAB
250 mg | ORAL_TABLET | Freq: Two times a day (BID) | ORAL | 0 refills | 10.00000 days | Status: AC
Start: 2019-12-19 — End: ?

## 2019-12-19 MED ORDER — FLUCONAZOLE 150 MG PO TAB
150 mg | ORAL_TABLET | Freq: Once | ORAL | 1 refills | 3.00000 days | Status: AC
Start: 2019-12-19 — End: ?

## 2019-12-20 DIAGNOSIS — R3129 Other microscopic hematuria: Secondary | ICD-10-CM

## 2019-12-20 LAB — URINALYSIS, MICROSCOPIC

## 2019-12-21 ENCOUNTER — Encounter: Admit: 2019-12-21 | Discharge: 2019-12-21 | Payer: BC Managed Care – PPO

## 2019-12-21 DIAGNOSIS — G43009 Migraine without aura, not intractable, without status migrainosus: Secondary | ICD-10-CM

## 2019-12-22 ENCOUNTER — Encounter: Admit: 2019-12-22 | Discharge: 2019-12-22 | Payer: BC Managed Care – PPO

## 2019-12-22 MED ORDER — RIZATRIPTAN 10 MG PO TAB
ORAL_TABLET | 0 refills | Status: AC
Start: 2019-12-22 — End: ?
  Filled 2019-12-22: qty 10, 20d supply, fill #1

## 2019-12-23 ENCOUNTER — Encounter: Admit: 2019-12-23 | Discharge: 2019-12-23 | Payer: BC Managed Care – PPO

## 2019-12-23 DIAGNOSIS — Z8619 Personal history of other infectious and parasitic diseases: Secondary | ICD-10-CM

## 2019-12-23 DIAGNOSIS — E039 Hypothyroidism, unspecified: Secondary | ICD-10-CM

## 2019-12-23 DIAGNOSIS — F419 Anxiety disorder, unspecified: Secondary | ICD-10-CM

## 2019-12-23 DIAGNOSIS — Z8669 Personal history of other diseases of the nervous system and sense organs: Secondary | ICD-10-CM

## 2020-01-21 ENCOUNTER — Encounter: Admit: 2020-01-21 | Discharge: 2020-01-21 | Payer: BC Managed Care – PPO

## 2020-01-21 DIAGNOSIS — G43009 Migraine without aura, not intractable, without status migrainosus: Secondary | ICD-10-CM

## 2020-01-21 MED ORDER — RIZATRIPTAN 10 MG PO TAB
ORAL_TABLET | 0 refills | Status: AC
Start: 2020-01-21 — End: ?
  Filled 2020-01-21: qty 10, 18d supply, fill #1

## 2020-01-21 MED FILL — LEVOTHYROXINE 75 MCG PO TAB: 75 mcg | ORAL | 90 days supply | Qty: 90 | Fill #2 | Status: AC

## 2020-01-21 MED FILL — SERTRALINE 50 MG PO TAB: 50 mg | ORAL | 90 days supply | Qty: 90 | Fill #2 | Status: AC

## 2020-01-22 ENCOUNTER — Encounter: Admit: 2020-01-22 | Discharge: 2020-01-22 | Payer: BC Managed Care – PPO

## 2020-02-19 ENCOUNTER — Encounter: Admit: 2020-02-19 | Discharge: 2020-02-19 | Payer: BC Managed Care – PPO

## 2020-02-19 DIAGNOSIS — G43009 Migraine without aura, not intractable, without status migrainosus: Secondary | ICD-10-CM

## 2020-02-19 MED ORDER — RIZATRIPTAN 10 MG PO TAB
ORAL_TABLET | 0 refills
Start: 2020-02-19 — End: ?

## 2020-02-20 ENCOUNTER — Encounter: Admit: 2020-02-20 | Discharge: 2020-02-20 | Payer: BC Managed Care – PPO

## 2020-02-20 MED FILL — RIZATRIPTAN 10 MG PO TAB: 10 mg | 30 days supply | Qty: 10 | Fill #1 | Status: AC

## 2020-03-15 ENCOUNTER — Encounter: Admit: 2020-03-15 | Discharge: 2020-03-15 | Payer: BC Managed Care – PPO

## 2020-03-15 DIAGNOSIS — G43009 Migraine without aura, not intractable, without status migrainosus: Secondary | ICD-10-CM

## 2020-03-15 MED ORDER — RIZATRIPTAN 10 MG PO TAB
ORAL_TABLET | 0 refills
Start: 2020-03-15 — End: ?

## 2020-03-16 ENCOUNTER — Encounter: Admit: 2020-03-16 | Discharge: 2020-03-16 | Payer: BC Managed Care – PPO

## 2020-03-16 MED FILL — RIZATRIPTAN 10 MG PO TAB: 10 mg | 18 days supply | Qty: 10 | Fill #1 | Status: AC

## 2020-03-22 ENCOUNTER — Encounter: Admit: 2020-03-22 | Discharge: 2020-03-22 | Payer: BC Managed Care – PPO

## 2020-03-22 DIAGNOSIS — Z30431 Encounter for routine checking of intrauterine contraceptive device: Secondary | ICD-10-CM

## 2020-03-22 DIAGNOSIS — N939 Abnormal uterine and vaginal bleeding, unspecified: Secondary | ICD-10-CM

## 2020-04-01 ENCOUNTER — Encounter: Admit: 2020-04-01 | Discharge: 2020-04-01 | Payer: BC Managed Care – PPO

## 2020-04-16 ENCOUNTER — Encounter: Admit: 2020-04-16 | Discharge: 2020-04-16 | Payer: BC Managed Care – PPO

## 2020-04-16 MED FILL — LEVOTHYROXINE 75 MCG PO TAB: 75 mcg | ORAL | 90 days supply | Qty: 90 | Fill #3 | Status: AC

## 2020-04-16 MED FILL — SERTRALINE 50 MG PO TAB: 50 mg | ORAL | 90 days supply | Qty: 90 | Fill #3 | Status: AC

## 2020-04-19 ENCOUNTER — Encounter: Admit: 2020-04-19 | Discharge: 2020-04-19 | Payer: BC Managed Care – PPO

## 2020-04-20 ENCOUNTER — Encounter: Admit: 2020-04-20 | Discharge: 2020-04-20 | Payer: BC Managed Care – PPO

## 2020-04-20 MED FILL — RIZATRIPTAN 10 MG PO TAB: 10 mg | 30 days supply | Qty: 10 | Fill #1 | Status: AC

## 2020-05-13 ENCOUNTER — Encounter: Admit: 2020-05-13 | Discharge: 2020-05-14 | Payer: BC Managed Care – PPO

## 2020-05-13 LAB — COVID-19 (SARS-COV-2) PCR

## 2020-05-13 NOTE — Progress Notes
Patient arrived to COVID clinic for COVID-19 testing 05/13/20 0745. Patient identity confirmed via photo I.D. Nasopharyngeal procedure explained to the patient.   Nasopharyngeal swab completed left  Patient education provided given and instructed patient self isolate until contacted w/ results and further instructions. CDC handout on COVID-19 given to patient.   [email or web address]      Swab collected by [facility].    Reason for testing: sore throat, chills

## 2020-05-14 DIAGNOSIS — Z20822 Contact with and (suspected) exposure to covid-19: Secondary | ICD-10-CM

## 2020-05-23 ENCOUNTER — Encounter: Admit: 2020-05-23 | Discharge: 2020-05-23 | Payer: BC Managed Care – PPO

## 2020-05-23 DIAGNOSIS — G43009 Migraine without aura, not intractable, without status migrainosus: Secondary | ICD-10-CM

## 2020-05-23 MED ORDER — RIZATRIPTAN 10 MG PO TAB
ORAL_TABLET | 0 refills
Start: 2020-05-23 — End: ?

## 2020-05-24 ENCOUNTER — Encounter: Admit: 2020-05-24 | Discharge: 2020-05-24 | Payer: BC Managed Care – PPO

## 2020-05-25 ENCOUNTER — Encounter: Admit: 2020-05-25 | Discharge: 2020-05-25 | Payer: BC Managed Care – PPO

## 2020-05-25 MED FILL — RIZATRIPTAN 10 MG PO TAB: 10 mg | 30 days supply | Qty: 10 | Fill #1 | Status: AC

## 2020-06-07 ENCOUNTER — Encounter: Admit: 2020-06-07 | Discharge: 2020-06-08 | Payer: BC Managed Care – PPO

## 2020-06-07 DIAGNOSIS — Z20822 Exposure to 2019 novel coronavirus: Principal | ICD-10-CM

## 2020-06-13 ENCOUNTER — Encounter: Admit: 2020-06-13 | Discharge: 2020-06-13 | Payer: BC Managed Care – PPO

## 2020-06-13 NOTE — Telephone Encounter
Pt calling back for Aby to get further instructions on returning to work. Notified Aby who said would call her back.

## 2020-06-19 ENCOUNTER — Encounter: Admit: 2020-06-19 | Discharge: 2020-06-19 | Payer: BC Managed Care – PPO

## 2020-06-19 DIAGNOSIS — G43009 Migraine without aura, not intractable, without status migrainosus: Secondary | ICD-10-CM

## 2020-06-19 MED ORDER — RIZATRIPTAN 10 MG PO TAB
ORAL_TABLET | 0 refills
Start: 2020-06-19 — End: ?

## 2020-06-21 ENCOUNTER — Encounter: Admit: 2020-06-21 | Discharge: 2020-06-21 | Payer: BC Managed Care – PPO

## 2020-06-21 MED FILL — RIZATRIPTAN 10 MG PO TAB: 10 mg | 30 days supply | Qty: 10 | Fill #1 | Status: AC

## 2020-07-13 ENCOUNTER — Encounter: Admit: 2020-07-13 | Discharge: 2020-07-13 | Payer: BC Managed Care – PPO

## 2020-07-13 DIAGNOSIS — G43009 Migraine without aura, not intractable, without status migrainosus: Secondary | ICD-10-CM

## 2020-07-13 MED ORDER — RIZATRIPTAN 10 MG PO TAB
ORAL_TABLET | 0 refills | Status: AC
Start: 2020-07-13 — End: ?
  Filled 2020-07-13: qty 10, 17d supply, fill #1

## 2020-07-13 MED FILL — SERTRALINE 50 MG PO TAB: 50 mg | ORAL | 90 days supply | Qty: 90 | Fill #4 | Status: AC

## 2020-07-13 MED FILL — LEVOTHYROXINE 75 MCG PO TAB: 75 mcg | ORAL | 90 days supply | Qty: 90 | Fill #4 | Status: AC

## 2020-08-01 ENCOUNTER — Encounter: Admit: 2020-08-01 | Discharge: 2020-08-01 | Payer: BC Managed Care – PPO

## 2020-08-01 DIAGNOSIS — G43009 Migraine without aura, not intractable, without status migrainosus: Secondary | ICD-10-CM

## 2020-08-01 MED ORDER — RIZATRIPTAN 10 MG PO TAB
ORAL_TABLET | 0 refills
Start: 2020-08-01 — End: ?

## 2020-08-02 ENCOUNTER — Encounter: Admit: 2020-08-02 | Discharge: 2020-08-02 | Payer: BC Managed Care – PPO

## 2020-08-02 MED FILL — RIZATRIPTAN 10 MG PO TAB: 10 mg | 30 days supply | Qty: 10 | Fill #1 | Status: AC

## 2020-08-27 ENCOUNTER — Encounter: Admit: 2020-08-27 | Discharge: 2020-08-27 | Payer: BC Managed Care – PPO

## 2020-09-02 ENCOUNTER — Encounter: Admit: 2020-09-02 | Discharge: 2020-09-02 | Payer: BC Managed Care – PPO

## 2020-09-02 DIAGNOSIS — M79604 Pain in right leg: Secondary | ICD-10-CM

## 2020-09-03 ENCOUNTER — Encounter: Admit: 2020-09-03 | Discharge: 2020-09-03 | Payer: BC Managed Care – PPO

## 2020-09-03 ENCOUNTER — Ambulatory Visit: Admit: 2020-09-03 | Discharge: 2020-09-03 | Payer: BC Managed Care – PPO

## 2020-09-03 DIAGNOSIS — G43009 Migraine without aura, not intractable, without status migrainosus: Secondary | ICD-10-CM

## 2020-09-03 DIAGNOSIS — E039 Hypothyroidism, unspecified: Secondary | ICD-10-CM

## 2020-09-03 DIAGNOSIS — Z8619 Personal history of other infectious and parasitic diseases: Secondary | ICD-10-CM

## 2020-09-03 DIAGNOSIS — M79604 Pain in right leg: Secondary | ICD-10-CM

## 2020-09-03 DIAGNOSIS — Z8669 Personal history of other diseases of the nervous system and sense organs: Secondary | ICD-10-CM

## 2020-09-03 DIAGNOSIS — F419 Anxiety disorder, unspecified: Secondary | ICD-10-CM

## 2020-09-03 MED ORDER — RIZATRIPTAN 10 MG PO TAB
ORAL_TABLET | 0 refills
Start: 2020-09-03 — End: ?

## 2020-09-03 NOTE — Patient Instructions
[facility]  [address]  [place], [place] [address]      Dr. Theodoro Grist has recommended vein procedure(s) including, an ablation and microphlebectomy    We will give your information to our insurance department and they will submit to your insurance for approval of these procedures.  Approval could take up to 30 business days.  Once a response is received from your insurance, you will receive a call or letter with your benefit information.  At that time if you wish to proceed, we can get you scheduled.    You need to obtain a prescription strength compression stocking (20-30 mmHg) prior to your procedure.  These should be thigh high or panty-hose length.      Shower and shave the operative leg from groin to ankle the day before your procedure.  Wear loose, comfortable clothing (such as sweatpants) to your procedure.    You may be instructed to apply EMLA cream to operative leg approximately two hours prior to procedure.    If an anti-anxiety medication is prescribed, you will need a driver to take you home from your procedure.    Do not come to the office on an empty stomach.  We encourage you to have a small meal 1-2 hours prior to arriving at the office.  You may take all your medications as normally scheduled.    Expect to be in our office for 1-2 hours on your scheduled procedure date.  Please allow flexibility in your schedule as procedure start times are subject to change.    You may have a scheduled ultrasound appointment within 24-72 hours after your procedure.    Multiple dates of service are often required to accommodate all recommended procedures    Most patients experience some discomfort following the procedure.  Some people find it helpful to take one to two days off from work; although we expect you to be able to resume most pre-procedure activities the following day (extended periods of time off work/school will not be excused).      We would like you to continue to keep moving and maintain a normal level of activity post procedure.  Avoid heavy aerobic activity, weightlifting or any water activities for one week.  Avoid air travel for one week.     A thigh high or pantyhose compression stocking will be worn post operatively for a period of up to 4 weeks.    Please note that although this procedure is being performed in the office it is still considered a minor procedure and will require some recovery.  Please make arrangements if you need someone to help in your care.    Please ensure that your MyChart access is active as this is how our office will communicate with you.  This is also the preferred method of communication to the nursing staff.          Sclerotherapy for the Treatment of Spider Veins   Frequently asked questions    1. What is sclerotherapy?   Sclerotherapy is a non-surgical procedure for closing unwanted superficial leg veins.  A sclerosing solution is injected directly into the blood vessel with a very fine needle.  The solution irritates the lining of the vessel causing it to seal closed.  Over a period of weeks, the vessel turns into scar tissue and eventually fades, becoming barely noticeable.  A single blood vessel may have to be injected more than once, usually 6-8 weeks  apart, depending upon the size.  A number of vessels may be injected during your treatment session, each treatment session is 30-40 minutes in length.    2. What should I expect?  You can expect to require 3-5 treatment sessions for up to 80% clearing of your unwanted spider veins.  Each session is scheduled at least 6 weeks after the previous treatment.  Your leg will continue to look better for months after the most recent visit.    3. Is there any pain with the procedure?   The injection is given through a fine needle that causes a pinprick feeling.  Some patients experience a slight to moderated burning sensation immediately after the injection that disappears within seconds.    4. What do I do after the procedure?   A compression stocking is suggested for 7 days following each treatment to reduce the risk of bruising and assist in sealing the treated vessels.    5. Will treated veins recur?   Spider veins can recur.  It may seem that previously injected vessel has recurred when a new spider vein has actrually appeared in the same area.  Some patients require minimal maintenance to preserve the marginal recurrence of spider veins.    6. Are there any side effects?   As with any procedural treatment, there can be side effects to sclerotherapy, including but not limited to:    -Stinging or pain at the injection site    -Red raised areas at the injection site    -[employer] of spots at the site of the treated vessel    -Development of fine red blood vessel groups near the injection site    -Small painful ulcers at the treatment site    -Temporary bruising    -Allergic reaction    -Inflammation of treated vessels    -Lumps in injected vessels        Sclerotherapy Pricing    Sclerotherapy for spider veins is considered an elective, cosmetic procedure and is not billed to insurance.    Therapy Pricing    The cost of treatment is $418 per treatment session.    The number of treatment session may vary per patient based on the number of spider veins.     A treatment plan will be established during your free consultation with the sclerotherapy nurse.    This payment is made per treatment on the day of the procedure.    Sclerotherapy cancellation policy     We require 48-hour notice for canceling sclerotherapy treatment.  There is a $50 charge that will be billed to your account if your treatment is not canceled OR if 48-hour notice is NOT given.  Cancellation charge will be applied to next treatment.

## 2020-09-06 ENCOUNTER — Encounter: Admit: 2020-09-06 | Discharge: 2020-09-06 | Payer: BC Managed Care – PPO

## 2020-09-06 MED FILL — RIZATRIPTAN 10 MG PO TAB: 10 mg | 20 days supply | Qty: 10 | Fill #1 | Status: AC

## 2020-09-07 ENCOUNTER — Encounter: Admit: 2020-09-07 | Discharge: 2020-09-07 | Payer: BC Managed Care – PPO

## 2020-09-14 ENCOUNTER — Encounter: Admit: 2020-09-14 | Discharge: 2020-09-14 | Payer: BC Managed Care – PPO

## 2020-09-15 ENCOUNTER — Encounter: Admit: 2020-09-15 | Discharge: 2020-09-15 | Payer: BC Managed Care – PPO

## 2020-09-15 NOTE — Telephone Encounter
Patient called in to see if she could have her auth extended for the right leg until November. I have sent a message to [facility] to have it extended

## 2020-10-03 ENCOUNTER — Encounter: Admit: 2020-10-03 | Discharge: 2020-10-03 | Payer: BC Managed Care – PPO

## 2020-10-03 DIAGNOSIS — E039 Hypothyroidism, unspecified: Secondary | ICD-10-CM

## 2020-10-03 DIAGNOSIS — F419 Anxiety disorder, unspecified: Secondary | ICD-10-CM

## 2020-10-03 DIAGNOSIS — G43009 Migraine without aura, not intractable, without status migrainosus: Secondary | ICD-10-CM

## 2020-10-03 MED ORDER — RIZATRIPTAN 10 MG PO TAB
ORAL_TABLET | 0 refills
Start: 2020-10-03 — End: ?

## 2020-10-03 MED ORDER — SERTRALINE 50 MG PO TAB
50 mg | ORAL_TABLET | Freq: Every day | ORAL | 3 refills
Start: 2020-10-03 — End: ?

## 2020-10-03 MED ORDER — LEVOTHYROXINE 75 MCG PO TAB
75 ug | ORAL_TABLET | Freq: Every day | ORAL | 3 refills
Start: 2020-10-03 — End: ?

## 2020-10-04 ENCOUNTER — Encounter: Admit: 2020-10-04 | Discharge: 2020-10-04 | Payer: BC Managed Care – PPO

## 2020-10-04 MED FILL — SERTRALINE 50 MG PO TAB: 50 mg | ORAL | 90 days supply | Qty: 90 | Fill #1 | Status: AC

## 2020-10-04 MED FILL — RIZATRIPTAN 10 MG PO TAB: 10 mg | 30 days supply | Qty: 10 | Fill #1 | Status: AC

## 2020-10-04 MED FILL — LEVOTHYROXINE 75 MCG PO TAB: 75 mcg | ORAL | 90 days supply | Qty: 90 | Fill #1 | Status: AC

## 2020-10-26 ENCOUNTER — Ambulatory Visit: Admit: 2020-10-26 | Discharge: 2020-10-26 | Payer: BC Managed Care – PPO

## 2020-10-26 ENCOUNTER — Encounter: Admit: 2020-10-26 | Discharge: 2020-10-26 | Payer: BC Managed Care – PPO

## 2020-10-26 DIAGNOSIS — J382 Nodules of vocal cords: Secondary | ICD-10-CM

## 2020-10-26 DIAGNOSIS — R490 Dysphonia: Secondary | ICD-10-CM

## 2020-10-26 DIAGNOSIS — E039 Hypothyroidism, unspecified: Secondary | ICD-10-CM

## 2020-10-26 DIAGNOSIS — Z8669 Personal history of other diseases of the nervous system and sense organs: Secondary | ICD-10-CM

## 2020-10-26 DIAGNOSIS — F419 Anxiety disorder, unspecified: Secondary | ICD-10-CM

## 2020-10-26 DIAGNOSIS — Z8619 Personal history of other infectious and parasitic diseases: Secondary | ICD-10-CM

## 2020-10-26 NOTE — Progress Notes
HISTORY OF PRESENT ILLNESS:  Kara Ponce is a 35 y.o. female who presents to clinic today as a new patient to my clinic but has been seen in the ENT clinics by Dr. Juel Burrow for dizziness.  She has had hoarseness.  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  She had a vocal nodule at that point.  Voice rest helped as did vocal hygiene.  The patient did well for a while and is now starting to have hoarseness once again.  She finds some soreness and vocal changes she talks too much.  She has been trying to do well with her diet and caffeine.       Review of Systems   Constitutional: Negative.    HENT: Positive for voice change.    Eyes: Negative.    Respiratory: Negative.    Cardiovascular: Negative.    Gastrointestinal: Negative.    Endocrine: Negative.    Genitourinary: Negative.    Musculoskeletal: Negative.    Skin: Negative.    Allergic/Immunologic: Negative.    Neurological: Negative.    Hematological: Negative.    Psychiatric/Behavioral: Negative.        Past Medical/Surgical History  She  has a past medical history of Anxiety, History of chicken pox, History of migraine headaches, and Hypothyroidism.  Her  has a past surgical history that includes wisdom teeth extraction (2005); Cystoscopy (08/13/2009); Sigmoidoscopy (N/A, 12/26/2017); and Nerve Surgery (Right, 01/14/2018).        Medications/Allergies/Immunizations  Her current medication(s) include:   Current Outpatient Medications   Medication Sig Dispense Refill   ? diclofenac (VOLTAREN) 1 % topical gel Apply four g topically to affected area four times daily. 300 g 3   ? levothyroxine (SYNTHROID) 75 mcg tablet Take one tablet by mouth daily. 90 tablet 3   ? rizatriptan (MAXALT) 10 mg tablet Take one tablet by mouth at onset of headache. May repeat after 2 hours. Max of 30 mg in 24 hours. 10 tablet 0   ? sertraline (ZOLOFT) 50 mg tablet Take one tablet by mouth daily. 90 tablet 3     No current facility-administered medications for this visit.       Allergies: Sulfa (sulfonamide antibiotics)          Vitals:    10/26/20 0913   BP: 116/65   BP Source: Arm, Right Upper   Pulse: 83   Resp: 14   PainSc: Zero   Weight: 62.6 kg (138 lb)   Height: 160 cm (5' 3)     Body mass index is 24.45 kg/m?Marland Kitchen     Physical Exam    General: 35 y.o. female who is awake and alert and no acute distress.  SKIN:  Skin examination was unremarkable no mass or lesion appreciated no evidence of cellulitis.  No evidence of obvious skin cancers.    EYES: Extraocular muscle mobility is intact.  No conjunctival hemorrhage.   EARS:The auricles were without deformity.  External auditory canals are clear no evidence of cerumen fungus or bacterial infection.  Tympanic membranes are without effusion or retraction.  No evidence of perforation.  No cholesteatoma.    NOSE: The nasal airway shows a straight septum without evidence of perforation or significant crusting.  There are no evidence of polypoid changes.  The inferior turbinate is not congested.  There are no active bleeding sites.    ORAL CAVITY: The oral cavity shows buccal surfaces are without evidence of  lichenoid changes or mucosal disease.  No leukoplakia.  The floor of mouth is without edema.  Wharton's ducts and Stensen's ducts are patent with normal salivary flow.  The tongue is without mass or lesion. There is normal mobility and sensation.    OROPHARYNX: The oropharynx shows normal mucosa.   No significant postnasal drainage.  Uvula is without edema.  NECK: Neck was flat no adenopathy or thyromegaly.  No parotid masses or submandibular gland masses.    NEURO: Cranial nerves are intact bilaterally.  Voice quality is mildly hoarse  PULMONARY:  No airway distress.  No stridor.  No retractions.      After discussing with the patient fiberoptic flexible laryngoscopy was performed through the right nasal airway.  The nasal airway had been anesthetized using 4% lidocaine and decongested with Neo-Synephrine.  The nasal cavity was clear as was the nasopharynx no mass lesion or polyp.  The oropharynx was unremarkable.  The hypopharynx shows moderate hypopharyngeal edema.  No piriform masses appreciated.  Esophageal inlet unremarkable otherwise.  The base of tongue vallecula and epiglottis were unremarkable.  There was normal vocal cord mobility bilaterally.  Mucosal surfaces to the true vocal cords show what appears to be a submucosal nodule that is small on the left side mid cord with some mucus stranding between both sides.  No evidence of oropharyngeal hypopharyngeal or laryngeal carcinoma.  The patient tolerated the procedure well without complication.      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  I do believe that she would do best with speech therapy.  May require strobe examination.  Nothing that requires surgical intervention at this point.

## 2020-10-27 ENCOUNTER — Encounter: Admit: 2020-10-27 | Discharge: 2020-10-27 | Payer: BC Managed Care – PPO

## 2020-11-05 ENCOUNTER — Encounter: Admit: 2020-11-05 | Discharge: 2020-11-05 | Payer: BC Managed Care – PPO

## 2020-11-05 ENCOUNTER — Ambulatory Visit: Admit: 2020-11-05 | Discharge: 2020-11-05 | Payer: BC Managed Care – PPO

## 2020-11-05 DIAGNOSIS — E039 Hypothyroidism, unspecified: Secondary | ICD-10-CM

## 2020-11-05 DIAGNOSIS — J382 Nodules of vocal cords: Secondary | ICD-10-CM

## 2020-11-05 DIAGNOSIS — F419 Anxiety disorder, unspecified: Secondary | ICD-10-CM

## 2020-11-05 DIAGNOSIS — Z8669 Personal history of other diseases of the nervous system and sense organs: Secondary | ICD-10-CM

## 2020-11-05 DIAGNOSIS — Z8619 Personal history of other infectious and parasitic diseases: Secondary | ICD-10-CM

## 2020-11-05 DIAGNOSIS — R490 Dysphonia: Secondary | ICD-10-CM

## 2020-11-05 DIAGNOSIS — Z0000 Encounter for general adult medical examination without abnormal findings: Secondary | ICD-10-CM

## 2020-11-05 DIAGNOSIS — G43009 Migraine without aura, not intractable, without status migrainosus: Secondary | ICD-10-CM

## 2020-11-05 MED ORDER — RIZATRIPTAN 10 MG PO TAB
ORAL_TABLET | 5 refills | Status: AC
Start: 2020-11-05 — End: ?

## 2020-11-05 NOTE — Progress Notes
[facility]  Voice Evaluation [phone])  Staff: Alona Bene, M.D.  Date: 11/05/2020       S   Kara Ponce was referred to the [facility] per Dr. Coralie Carpen request for a speech pathology voice evaluation and subsequent treatment program.  Patient was seen by Dr. Milus Banister on 10/26/20.  Videostroboscopy was not performed.  Please see full report in patient's chart for further details. Patient presents with a diagnosis of vocal cord nodule.     O  CASE HISTORY  Voice complaint: Has had bilateral vocal nodules in 2021, went away after voice rest. About a month and a half ago, noticed hoarseness and has to use increased respiratory effort. Now has a left sided vocal nodule. This started after using a respirator. Voice is better in the morning.  Onset: About a year ago    Voice usage: personal, professional and social.  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    Patient Self Rating    Amount of talking: 4.  Stress level: 2.  Quality of voice on day of evaluation: 3.  Severity of problem: 4.    Medical Issues:   Head or Neck surgeries: None reported.  LPR/GERD s/s or meds: None reported. Was on pepcid daily, eliminated caffeine.  Seasonal or environmental allergies: Occasional, occasionally takes allergy medications.  Sinus infections: None reported.  Smoking history: None reported.  Postnasal drainage: Occasional.   Pulmonary history: None reported.  Dysphagia: None reported.    Dietary Issues:   Water: ~60 oz per day.  Caffeine: 1 cup of coffee per day.  Dairy: Cheese.  Chocolate: A piece a day.  Spicy foods: Daily.  Carbonation: Occasionally 1 soda a week.  Acidic foods: Weekly.    Frequent cough: No.  Freq throat clear: Yes  Hearing WNL?: Yes    VOICE EVALUATION    Acoustic Parameters:    A Multi-Dimensional Voice Profile (MDVP) was completed.  Full report generated and placed in patient's chart.  Patient exhibited a mean Fo of 198.7 Hz.    Aerodynamics:    Maximum sustained phonation: 8.9 seconds (<6 =abnormal, 15 or >= WNL).  S=23.4.  Z=13.4 S/Z Ratio= 1.7 .  (>than 1.4 considered abnormal)    Type of breathing noted: Clavicular.    Perceptual Ratings:    G 2.  R 2.  B 0.  A 0. S 2. TOTAL = 6.    Pitch  Amount of pitch variability: WNL.  Pitch breaks: No.    Loudness  Typical level of loudness: Appropriate for situation.  Amount of loudness variability: Range of emphasis WNL.  Loudness range from soft to maximum level: YES (normal range).    Articulation: WNL.  Rate: WNL.  Resonance: WNL.  Tension/Effort noted in the jaw, neck, face, and perilaryngeal areas: Mild.  Pain upon palpation of the perilaryngeal area: No.    Voice was characterized as: Glottal fry-moderate, mild diplophonia    A  IMPRESSIONS  Patient presents with: mild dysphonia.    The following therapeutic strategies were stimulable: Resonance Voice Therapy.  Patient given front and forward focus exercises, and completed diaphragmatic breathing, cup bubbling, humming and single words. Additionally, she was given a handout on vocal hygiene.       Prognosis for modifying etiologic factors and improving vocal techniques is: good.    P  RECOMMENDATIONS  Voice Therapy - Approximately 10 sessions over the next 6 to 8 weeks working on the following  goals:    Long-term goal:  Patient will exhibit optimal function for professional, personal, and social needs.    Short-term goals:   ALL GOALS INITIATED THIS DATE.  1. Patient will increase understanding of normal phonation and abnormal phonation.    2. Patient will increase understanding of and compliance with vocal hygiene guidelines. Patient will identify good vocal hygiene given functional situations with greater than 90% accuracy.    3. Patient will demonstrate diaphragmatic breathing during structured and conversational speech tasks with 90% accuracy.    4. Patient will participate in exercises specifically focused on reducing the degree of musculoskeletal tension with greater than 90% accuracy.    5. Patient will demonstrate a front and forward focus while voicing with 90% accuracy during structured and conversational speech tasks.    The findings and recommendations were discussed with the patient and referring physician with good understanding and agreement.    The ENT speech pathology service plans to continue to follow this patient in the future for any potential/ongoing outpatient voice, swallowing, articulation or trismus related care.

## 2020-11-05 NOTE — Progress Notes
Date of Service: 11/05/2020    Kara Ponce is a 35 y.o. female.  DOB: 02-04-1986  MRN: [id number]     Subjective:             History of Present Illness  Chief Complaint   Patient presents with   ? Physical     No new issues       ? Hypothyroidism:   She takes levothyroxine 75 mcg daily.  ? Migraines:   She takes Maxalt as needed for severe migraines.  ? Anxiety:  Sertraline 50mg  every day  ? Voice hoarseness:  Noted to have a vocal cord nodule on flexible laryngoscopy with Dr. Milus Banister ([facility]). She is following with speech therapy. She will also see Dr. Marisa Sprinkles ([facility]).  ? Healthcare maintenance:  -Last Pap:  6 months ago with Dr. Wynonia Lawman.   -Immunizations:  UTD with [employer] employee health    -Exercise:  7 days per week on her [employer]        Medical History:   Diagnosis Date   ? Anxiety    ? History of chicken pox    ? History of migraine headaches    ? Hypothyroidism      Surgical History:   Procedure Laterality Date   ? HX WISDOM TEETH EXTRACTION  2005   ? CYSTOSCOPY  08/13/2009    NORMAL   ? rectal nodule N/A 12/26/2017    Performed by Tempie Hoist, DO at [facility] ENDO   ? Right cubital tunnel release, possible transposition Right 01/14/2018    Performed by Desiree Hane, MD at [facility] OR   ? ANORECTAL MANOMETRY N/A 02/15/2018    Performed by Eliott Nine, MD at [facility] ENDO     Family History   Problem Relation Age of Onset   ? Hypertension Mother    ? Thyroid Disease Mother    ? Heart Disease Mother    ? Hypertension Father    ? Stroke Father    ? Hearing Loss Father    ? High Cholesterol Father    ? Diabetes Maternal Grandfather    ? Heart Disease Paternal Grandmother    ? Heart Disease Paternal Grandfather    ? Anesthetic Complication Neg Hx    ? Cancer-Breast Neg Hx    ? Cancer-Colon Neg Hx    ? Cancer-Ovarian Neg Hx    ? Cancer-Uterine Neg Hx    ? Bleeding Disorders Neg Hx    ? VTE Neg Hx    ? Cancer Neg Hx      Social History     Socioeconomic History   ? Marital status: Married     Spouse name: Leighton Parody   ? Number of children: 2   Occupational History   ? Occupation: Education administrator: [employer]   Tobacco Use   ? Smoking status: Never Smoker   ? Smokeless tobacco: Never Used   Substance and Sexual Activity   ? Alcohol use: No     Alcohol/week: 1.0 - 2.0 standard drink     Types: 1 - 2 Standard drinks or equivalent per week   ? Drug use: No   ? Sexual activity: Yes     Partners: Male     Birth control/protection: I.U.D.   Other Topics Concern   ? Caffeine Concern Yes     Comment: 1 cup per day   ? Exercise Yes     Comment: 5x per week, run , elliptical   ?  Seat Belt Yes   ? Self-Exams Yes   Social History Narrative    She works as a Garment/textile technologist for [employer].  She has two young children.               Review of Systems   Constitutional: Negative for chills, fever, malaise/fatigue, weight gain and weight loss.   HENT: Negative for congestion and sore throat.    Eyes: Negative for visual disturbance.   Cardiovascular: Negative for chest pain, dyspnea on exertion, leg swelling and palpitations.   Respiratory: Negative for cough and shortness of breath.    Skin: Negative for rash.   Musculoskeletal: Negative for joint pain and myalgias.   Gastrointestinal: Negative for abdominal pain, change in bowel habit, diarrhea, hematochezia, melena, nausea and vomiting.   Genitourinary: Negative for dysuria, frequency and hematuria.   Neurological: Positive for headaches. Negative for dizziness and light-headedness.   Psychiatric/Behavioral: Negative for depression. The patient is nervous/anxious.            Objective:         ? diclofenac (VOLTAREN) 1 % topical gel Apply four g topically to affected area four times daily.   ? levothyroxine (SYNTHROID) 75 mcg tablet Take one tablet by mouth daily.   ? rizatriptan (MAXALT) 10 mg tablet Take one tablet by mouth at onset of headache. May repeat after 2 hours. Max of 30 mg in 24 hours.   ? sertraline (ZOLOFT) 50 mg tablet Take one tablet by mouth daily.     Vitals:    11/05/20 0930   BP: 106/68 BP Source: Arm, Left Upper   Pulse: 71   Temp: 36.6 ?C (97.8 ?F)   Resp: 14   SpO2: 98%   TempSrc: Temporal   PainSc: Zero   Weight: 64.4 kg (142 lb)   Height: 160 cm (5' 3)     Body mass index is 25.15 kg/m?Marland Kitchen     Physical Exam  Vitals and nursing note reviewed.   Constitutional:       General: She is not in acute distress.     Appearance: She is well-developed.   HENT:      Head: Normocephalic and atraumatic.      Right Ear: Tympanic membrane, ear canal and external ear normal.      Left Ear: Tympanic membrane, ear canal and external ear normal.   Eyes:      Conjunctiva/sclera: Conjunctivae normal.   Neck:      Thyroid: No thyromegaly.      Vascular: No carotid bruit.   Cardiovascular:      Rate and Rhythm: Normal rate and regular rhythm.      Heart sounds: Normal heart sounds.   Pulmonary:      Effort: Pulmonary effort is normal. No respiratory distress.      Breath sounds: Normal breath sounds. No wheezing or rales.   Chest:      Chest wall: No tenderness.   Abdominal:      General: Bowel sounds are normal. There is no distension.      Palpations: Abdomen is soft.      Tenderness: There is no abdominal tenderness.   Musculoskeletal:         General: No tenderness.      Cervical back: Neck supple.   Lymphadenopathy:      Cervical: No cervical adenopathy.   Skin:     General: Skin is warm and dry.   Neurological:      Mental Status:  She is alert and oriented to person, place, and time.   Psychiatric:         Behavior: Behavior normal.         Thought Content: Thought content normal.              Assessment and Plan:  Kara Ponce was seen today for physical.    Diagnoses and all orders for this visit:    Routine general medical examination at a health care facility  -     COMPREHENSIVE METABOLIC PANEL; Future; Expected date: 11/05/2020  -     CBC AND DIFF; Future; Expected date: 11/05/2020  -     LIPID PROFILE; Future; Expected date: 11/05/2020  -     THYROID STIMULATING HORMONE-TSH; Future; Expected date: 11/05/2020    Migraine without aura and without status migrainosus, not intractable  -     rizatriptan (MAXALT) 10 mg tablet; Take one tablet by mouth at onset of headache. May repeat after 2 hours. Max of 30 mg in 24 hours.    Hypothyroidism, unspecified type  - Levothyroxine 75 mcg qd  -     COMPREHENSIVE METABOLIC PANEL; Future; Expected date: 11/05/2020  -     CBC AND DIFF; Future; Expected date: 11/05/2020  -     LIPID PROFILE; Future; Expected date: 11/05/2020  -     THYROID STIMULATING HORMONE-TSH; Future; Expected date: 11/05/2020    Anxiety  - Continue Zoloft 50mg  qd         RTC in 1 year for physical.

## 2020-11-05 NOTE — Progress Notes
ATTESTATION    I have reviewed the information from this visit and agree with the impression and recommendations.    Staff name:  Eriana Suliman M Chenise Mulvihill, MD Date:  11/05/2020

## 2020-11-08 ENCOUNTER — Encounter: Admit: 2020-11-08 | Discharge: 2020-11-08 | Payer: BC Managed Care – PPO

## 2020-11-08 ENCOUNTER — Ambulatory Visit: Admit: 2020-11-08 | Discharge: 2020-11-08 | Payer: BC Managed Care – PPO

## 2020-11-08 DIAGNOSIS — G43009 Migraine without aura, not intractable, without status migrainosus: Secondary | ICD-10-CM

## 2020-11-08 DIAGNOSIS — E039 Hypothyroidism, unspecified: Secondary | ICD-10-CM

## 2020-11-08 DIAGNOSIS — J382 Nodules of vocal cords: Secondary | ICD-10-CM

## 2020-11-08 DIAGNOSIS — Z0000 Encounter for general adult medical examination without abnormal findings: Secondary | ICD-10-CM

## 2020-11-08 LAB — COMPREHENSIVE METABOLIC PANEL
ALBUMIN: 4.6 g/dL (ref 3.5–5.0)
ALK PHOSPHATASE: 45 U/L (ref 25–110)
ALT: 7 U/L (ref 7–56)
ANION GAP: 10 K/UL (ref 3–12)
AST: 20 U/L (ref 7–40)
BLD UREA NITROGEN: 17 mg/dL (ref 7–25)
CALCIUM: 8.9 mg/dL (ref 8.5–10.6)
CHLORIDE: 105 MMOL/L (ref 98–110)
CO2: 24 MMOL/L (ref 21–30)
CREATININE: 0.8 mg/dL (ref 0.4–1.00)
EGFR: >60 mL/min (ref >60)
GLUCOSE,PANEL: 87 mg/dL — ABNORMAL LOW (ref 70–100)
POTASSIUM: 3.9 MMOL/L (ref 3.5–5.1)
SODIUM: 139 MMOL/L — ABNORMAL LOW (ref 137–147)
TOTAL BILIRUBIN: 0.6 mg/dL (ref 0.3–1.2)
TOTAL PROTEIN: 7 g/dL (ref 6.0–8.0)

## 2020-11-08 LAB — LIPID PROFILE
CHOLESTEROL: 157 mg/dL (ref <200)
TRIGLYCERIDES: 42 mg/dL (ref <150)

## 2020-11-08 LAB — CBC AND DIFF
ABSOLUTE BASO COUNT: 0 K/UL (ref 0–0.20)
ABSOLUTE EOS COUNT: 0.1 K/UL (ref 0–0.45)
RBC COUNT: 4.7 M/UL — ABNORMAL HIGH (ref <100)
WBC COUNT: 4.1 K/UL — ABNORMAL LOW (ref >40)

## 2020-11-08 LAB — THYROID STIMULATING HORMONE-TSH: TSH: 1.9 uU/mL — ABNORMAL LOW (ref 0.35–5.00)

## 2020-11-08 MED ORDER — RIZATRIPTAN 10 MG PO TAB
ORAL_TABLET | 5 refills | Status: AC
Start: 2020-11-08 — End: ?
  Filled 2020-11-09: qty 12, 30d supply, fill #1

## 2020-11-09 ENCOUNTER — Encounter: Admit: 2020-11-09 | Discharge: 2020-11-09 | Payer: BC Managed Care – PPO

## 2020-11-11 ENCOUNTER — Ambulatory Visit: Admit: 2020-11-11 | Discharge: 2020-11-11 | Payer: BC Managed Care – PPO

## 2020-11-11 ENCOUNTER — Encounter: Admit: 2020-11-11 | Discharge: 2020-11-11 | Payer: BC Managed Care – PPO

## 2020-11-11 DIAGNOSIS — J382 Nodules of vocal cords: Secondary | ICD-10-CM

## 2020-12-02 ENCOUNTER — Encounter: Admit: 2020-12-02 | Discharge: 2020-12-02 | Payer: BC Managed Care – PPO

## 2020-12-03 ENCOUNTER — Encounter: Admit: 2020-12-03 | Discharge: 2020-12-03 | Payer: BC Managed Care – PPO

## 2020-12-03 ENCOUNTER — Ambulatory Visit: Admit: 2020-12-03 | Discharge: 2020-12-03 | Payer: BC Managed Care – PPO

## 2020-12-03 DIAGNOSIS — R490 Dysphonia: Secondary | ICD-10-CM

## 2020-12-03 DIAGNOSIS — J382 Nodules of vocal cords: Secondary | ICD-10-CM

## 2020-12-03 MED FILL — RIZATRIPTAN 10 MG PO TAB: 10 mg | 30 days supply | Qty: 12 | Fill #2 | Status: AC

## 2020-12-03 NOTE — Progress Notes
[facility]  Voice Therapy [phone])  Staff: Kara Ponce, M.D.  Date: 12/03/2020      S   Kara Ponce was referred for videostroboscopy with a diagnosis of dysphonia. Patient has been experiencing hoarseness for the past year. Past medical history includes?a vocal nodule in 2021 that resolved after voice rest and vocal hygeine. Patient saw Dr. Milus Ponce on 10/26/20 who identified another vocal cord nodule. She returns to clinic today to work on the goals established during the voice evaluation session.    Patient comes to clinic today and reports that she is currently having raspy voice since taking a week off of work and using her voice more during that time. She notices that it's raspy but not feeling as much strain. Currently doing exercises 2x per day, doing a lot of the m words on the way to work.     O  Long-term goal:  Patient will exhibit optimal function for professional, personal, and social needs.    Short-term goals:     1.  Patient will increase understanding of normal phonation and abnormal phonation.  IN PROGRESS.   Patient identified abnormal voicing with ~70% accuracy given mod-max assist.    2.  Patient will increase understanding of and compliance with vocal hygiene guidelines. Patient will identify good vocal hygiene given functional situations with greater than 90% accuracy. IN PROGRESS.   Education was provided on 10 minutes of vocal rest for every hour of speaking, reduced yelling/raising voice, and trying to rest voice over the weekend (with the exception of voice exercises) due to current vocal nodule and recent strain/hoarseness.    3.  Patient will demonstrate diaphragmatic breathing during structured and conversational speech tasks with 90% accuracy.  PARTIALLY MET   Patient completed diaphragmatic breathing with 60% accuracy given direct clinician modeling. Cues provided for relaxed shoulders.     4.  Patient will participate in exercises specifically focused on reducing the degree of musculoskeletal tension with greater than 90% accuracy.  PARTIALLY MET   Voiceless cup bubbling- 100% accuracy independently.  Voiced cup bubbling- 100% accuracy independently.  Humming-80% accuracy mod assist and direct modeling.  Humm + ahh- 80% accuracy with laryngeal posturing initially and direct modeling.      5 . Patient will demonstrate a front and forward focus while voicing with 90% accuracy during structured and conversational speech tasks.  PARTIALLY MET   Single words-80% accuracy for chanting, ~70% accuracy for normal speaking given mod-max assist. Emphasis on breath support was beneficial.  Phrases- 70% accuracy for chanting given direct clinician modeling.    A  Kara Ponce demonstrates mild dysphonia characterized by strain and raspiness.  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 Her voice does sound hoarse and strained this date. She does feel less strain in presence of hoarseness, which she believes is due to exercise completion. She is doing exercises 2x per day currently, especially as she is driving to work.  She reports excellent compliance with recommendations provided in the previous session. Based upon compliance, motivation and response to today's session, prognosis for continued improvement with therapy is good.    Key points of today's session:  -Reviewed FFF in full  -Emphasis on diaphragmatic breathing      P  Recommendations include for patient to continue to work toward excellent vocal hygiene and complete voice exercises at  least twice daily.  She plans to return to clinic in 2-3 weeks to continue to work on the above goals.  Kara Ponce was encouraged to contact me with any further questions or concerns and left the clinic in excellent condition.    The ENT speech pathology service plans to continue to follow this patient in the future for any potential/ongoing outpatient voice, swallowing, articulation or trismus related care.

## 2020-12-17 ENCOUNTER — Ambulatory Visit: Admit: 2020-12-17 | Discharge: 2020-12-17 | Payer: BC Managed Care – PPO

## 2020-12-17 ENCOUNTER — Encounter: Admit: 2020-12-17 | Discharge: 2020-12-17 | Payer: BC Managed Care – PPO

## 2021-01-02 ENCOUNTER — Encounter: Admit: 2021-01-02 | Discharge: 2021-01-02 | Payer: BC Managed Care – PPO

## 2021-01-03 ENCOUNTER — Encounter: Admit: 2021-01-03 | Discharge: 2021-01-03 | Payer: BC Managed Care – PPO

## 2021-01-03 MED FILL — RIZATRIPTAN 10 MG PO TAB: 10 mg | 30 days supply | Qty: 12 | Fill #3 | Status: AC

## 2021-01-19 ENCOUNTER — Ambulatory Visit: Admit: 2021-01-19 | Discharge: 2021-01-19 | Payer: BC Managed Care – PPO

## 2021-01-19 ENCOUNTER — Encounter: Admit: 2021-01-19 | Discharge: 2021-01-19 | Payer: BC Managed Care – PPO

## 2021-01-19 DIAGNOSIS — R490 Dysphonia: Secondary | ICD-10-CM

## 2021-01-19 NOTE — Progress Notes
ATTESTATION    I have reviewed the information from this visit and agree with the impression and recommendations.    Staff name:  Darryn Kydd M Rayola Everhart, MD Date:  01/19/2021

## 2021-01-19 NOTE — Progress Notes
[facility]  Voice Therapy [phone])  Staff: Alona Bene, M.D.  Date: 01/19/2021      Kara Ponce?was referred for videostroboscopy with a diagnosis of dysphonia. Patient has been experiencing hoarseness for the past year. Past medical history includes?a vocal nodule in 2021 that resolved after voice rest?and vocal hygeine.?Patient saw Dr. Milus Banister on 10/26/20 who identified another vocal cord nodule.?She?returns to clinic today to work on the goals established during the voice evaluation session. She returns to clinic today to work on the goals established during the voice evaluation session.    11/11/20: Patient comes to clinic today and reports that she is currently having raspy voice since taking a week off of work and using her voice more?during that time. She notices that it's raspy but not feeling as much strain. Currently doing exercises 2x per day, doing a lot of the m words on the way to work.?  ?  12/17/20: Patient feels like her voice is tired from working all week, voice rest did help her recovery. She is doing her exercises 2x per day. Feels like she has become more aware of when she is using poor technique.    01/19/21: Voice feels pretty good, having a lot of allergies. Can tell when her voice needs rest, she feels strain. She feels that the exercises are helping but she did not do them the last week.     O  Long-term goal:  Patient will exhibit optimal function for professional, personal, and social needs.    Short-term goals:     1.  Patient will increase understanding of normal phonation and abnormal phonation.  PARTIALLY MET   Patient identified abnormal voicing with ~80% accuracy given minimal assist and direct clinician modeling.    2.  Patient will increase understanding of and compliance with vocal hygiene guidelines. Patient will identify good vocal hygiene given functional situations with greater than 90% accuracy.  PARTIALLY MET   Reviewed importance of reducing any voice damaging behaviors I.e. yelling. She has been taking vocal rest when her voice feels tired.    3.  Patient will demonstrate diaphragmatic breathing during structured and conversational speech tasks with 90% accuracy.  PARTIALLY MET   Patient completed diaphragmatic breathing with 80% accuracy given minimal cues.    4.  Patient will participate in exercises specifically focused on reducing the degree of musculoskeletal tension with greater than 90% accuracy.  PARTIALLY MET   Voiceless cup bubbling-?100% accuracy independently.  Voiced cup bubbling-?100% accuracy independently.  Humming-90% accuracy minimal assist  Humm + ahh-?80% accuracy with mod assist  ?  5 . Patient will demonstrate a front and forward focus while voicing with 90% accuracy during structured and conversational speech tasks.  PARTIALLY MET   Single words-100% accuracy for chanting, ~80% accuracy for normal speaking given mild assist. Emphasis on breath support was beneficial.  Phrases-?100% accuracy for chanting given direct clinician modeling, ~80% accuracy for normal speaking given mild assist.  Reading- 70% with mod-max assist and direct modeling.  Conversation- 50-60% with mod-max assist and direct modeling.    A  MRS Kilfoyle demonstrates mild dysphonia characterized by glottal fry and strain.  Patient feels that her voice has made a lot of improvement but still has times of fatigue and needs vocal rest. Educated patient on focusing on phrases, reading and conversation. She will work on this diligently for the next month. She has an appointment with Dr. Marisa Sprinkles this Friday.  She reports good  compliance with recommendations provided in the previous session with the exception for the last week when she was on vacation. Based upon compliance, motivation and response to today's session, prognosis for continued improvement with therapy is good.    Key points of today's session:  -Reviewed FFF in full.  -Vocal hygeine      P  Recommendations include for patient to continue to work toward excellent vocal hygiene and complete voice exercises at least twice daily.  She plans to return to clinic in 4-6 weeks to continue to work on the above goals.  MRS Ditullio was encouraged to contact me with any further questions or concerns and left the clinic in excellent condition.    The ENT speech pathology service plans to continue to follow this patient in the future for any potential/ongoing outpatient voice, swallowing, articulation or trismus related care.

## 2021-01-21 ENCOUNTER — Encounter: Admit: 2021-01-21 | Discharge: 2021-01-21 | Payer: BC Managed Care – PPO

## 2021-01-21 ENCOUNTER — Ambulatory Visit: Admit: 2021-01-21 | Discharge: 2021-01-21 | Payer: BC Managed Care – PPO

## 2021-01-21 DIAGNOSIS — J381 Polyp of vocal cord and larynx: Secondary | ICD-10-CM

## 2021-01-21 DIAGNOSIS — R490 Dysphonia: Secondary | ICD-10-CM

## 2021-01-21 DIAGNOSIS — Z8669 Personal history of other diseases of the nervous system and sense organs: Secondary | ICD-10-CM

## 2021-01-21 DIAGNOSIS — Z8619 Personal history of other infectious and parasitic diseases: Secondary | ICD-10-CM

## 2021-01-21 DIAGNOSIS — J387 Other diseases of larynx: Secondary | ICD-10-CM

## 2021-01-21 DIAGNOSIS — E039 Hypothyroidism, unspecified: Secondary | ICD-10-CM

## 2021-01-21 DIAGNOSIS — F419 Anxiety disorder, unspecified: Secondary | ICD-10-CM

## 2021-01-21 NOTE — Progress Notes
Date of Service: 01/21/2021    Subjective:             Kara Ponce is a 35 y.o. female.    History of Present Illness    Kara Ponce is a very pleasant 35 y.o. woman who is referred by Dr. Milus Banister for a complaint of dysphonia.      [name] reports that she started noticing some issues with her voice in 2020.  Prior to this she had not had any issues.  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  She was seen by Dr. Milus Banister, who noted a polyp or lesion on the left vocal fold.  She was started on omeprazole. She was enrolled in a voice therapy program.  Her voice has gotten better.  She still notes some strain at the end of the day, especially at the end of the week.    Presently, she still notes some increased effort and discomfort, especially near the end of the week.  She does not report vocal fatigue with use.  She is working to refrain from throat clearing.    She denies difficulties with swallowing.      She denies frank reflux symptoms such as retrosternal burning/pain after meals, regurgitation or a sour brash sensation in the back of the throat.  She does have a known hiatal hernia.     She denies difficulty breathing.      She reports seasonal or environmental allergies.      VOCAL DEMANDS/HYGIENE:  Kara Ponce works as a a Scientist, clinical (histocompatibility and immunogenetics) in the .  She reports that She is talkative.  Specific vocal demands include conversational voice and talking to patients.  She has young kids at home.  She reports that She drinks 24-36 oz of water per day and 8-12 oz of caffeinated beverage daily.  She is not currently smoking.    Family History   Problem Relation Age of Onset   ? Hypertension Mother    ? Thyroid Disease Mother    ? Heart Disease Mother    ? Hypertension Father    ? Stroke Father    ? Hearing Loss Father    ? High Cholesterol Father    ? Diabetes Maternal Grandfather    ? Heart Disease Paternal Grandmother    ? Heart Disease Paternal Grandfather ? Anesthetic Complication Neg Hx    ? Cancer-Breast Neg Hx    ? Cancer-Colon Neg Hx    ? Cancer-Ovarian Neg Hx    ? Cancer-Uterine Neg Hx    ? Bleeding Disorders Neg Hx    ? VTE Neg Hx    ? Cancer Neg Hx        Social History     Socioeconomic History   ? Marital status: Married     Spouse name: Leighton Parody   ? Number of children: 2   Occupational History   ? Occupation: Education administrator: [employer]   Tobacco Use   ? Smoking status: Never Smoker   ? Smokeless tobacco: Never Used   Substance and Sexual Activity   ? Alcohol use: No     Alcohol/week: 1.0 - 2.0 standard drink     Types: 1 - 2 Standard drinks or equivalent per week   ? Drug use: No   ? Sexual activity: Yes     Partners: Male     Birth control/protection: I.U.D.  Other Topics Concern   ? Caffeine Concern Yes     Comment: 1 cup per day   ? Exercise Yes     Comment: 5x per week, run , elliptical   ? Seat Belt Yes   ? Self-Exams Yes   Social History Narrative    She works as a Garment/textile technologist for [employer].  She has two young children.           Review of Systems      Objective:         ? diclofenac (VOLTAREN) 1 % topical gel Apply four g topically to affected area four times daily.   ? levothyroxine (SYNTHROID) 75 mcg tablet Take one tablet by mouth daily.   ? rizatriptan (MAXALT) 10 mg tablet Take one tablet by mouth at onset of headache. May repeat after 2 hours. Max of 30 mg in 24 hours.   ? sertraline (ZOLOFT) 50 mg tablet Take one tablet by mouth daily.     Vitals:    01/21/21 1052   BP: 111/75   Pulse: 73   PainSc: Zero   Weight: 63.5 kg (140 lb)   Height: 157.5 cm (5' 2)     Body mass index is 25.61 kg/m?Marland Kitchen     Physical Exam    EXAM:  Constitutional: Blood pressure 111/75, pulse 73, height 157.5 cm (5' 2), weight 63.5 kg (140 lb), currently breastfeeding.    alert, cooperative and no distress  Neurologic: Alert and oriented x 3.    Cranial nerves II - XII grossly intact and symmetric.   Eyes:  Pupils equal round and reactive.       Extraocular eye movements intact and full.  Ears:  Pinna appear normal.  Hearing intact to finger rustle.    External auditory canals without significant cerumen or debris.    Tympanic membrane intact without effusion, retraction or perforation.  Nose:  Dorsum straight.  Nares patent.    On anterior rhinoscopy,  the mucosa is moist.  Anterior turbinates 2+.    Septum deviates slight left.  No mucopurulent discharge or polyps.  Oral Cavity: Lips without cutaneous lesion.  Oral competence intact.    Dentition is in good repair.  Oral mucosa pink and moist.    No masses or lesions.  Clear fluid expressed from Stensen's duct.    Tongue mobile.  Floor of mouth without mass or lesion on bimanual palpation.      Oropharynx: No mucosal masses or lesions.  Tonsils 1+.    Soft palate elevates symmetrically.  Neck:  Soft, non-tender.  No appreciable lymphadenopathy.    Trachea midline.  Laryngeal landmarks palpable with normal crepitus.    Larynx elevates with swallowing.      Thyroid without mass or tenderness.  Mobile with swallow.  Respiratory: Breathing comfortably.  No suprasternal/suprclavicular retractions    or accessory muscle use.  No stridor.    VOICE EVALUATION:  Voice is graded G1 R1 B0 A0 S0.  Pitch is appropriate for gender.  Volume is unaffected.  Glottal fry is not appreciated. Tremor is not noted.  Voice breaks are not appreciated.  Articulation is normal.  Resonance is normal.       LARYNGOSCOPY:  After applying topical decongestant and anesthetic, flexible laryngoscopy was performed using a fiberoptic scope through the right nare.  The nasal passage was clear and without polyposis or mucopurulent secretions.  The nasopharyngeal mucosa appeared normal.  The fossa of Rosenmuller was clear and the eustachian tube  openings were without mass or lesion. The tongue base was symmetric and without mucosal mass or lesion.  The vallecula was clear.  The epiglottis was upright.  The aryepiglottic folds and false folds appeared normal and were without mucosal mass or lesion.  The visualized hypopharynx was unremarkable.  The true vocal folds were notable for a sessile polyp or pseudopolyp on the left mid fold.  This appears stable compared to her stroboscopy from May 2020.  The right and left cords adducted and abducted fully.  Though difficult to assess without stroboscopy, closure appeared to be complete.  A limited view of the subglottis revealed no mucosal lesions or stenosis.       Assessment and Plan:    ASSESSMENT:  Encounter Diagnoses   Name Primary?   ? Dysphonia Yes   ? Laryngeal hyperfunction    ? Vocal cord polyp        PLAN:    I have reviewed my findings with De Hollingshead.    Functionally, Ms. Demo is doing well.  She states this is the best her voice has been sometime.  She is doing well with her lifestyle modifications and her techniques.  We have agreed to follow-up periodically, she does have a polyp or pseudopolyp that tends to flare and cause her problems.  I plan to see her back in 1 year, sooner if issues.  All questions answered the best my ability.

## 2021-01-23 ENCOUNTER — Encounter: Admit: 2021-01-23 | Discharge: 2021-01-23 | Payer: BC Managed Care – PPO

## 2021-01-25 ENCOUNTER — Encounter: Admit: 2021-01-25 | Discharge: 2021-01-25 | Payer: BC Managed Care – PPO

## 2021-01-25 MED FILL — RIZATRIPTAN 10 MG PO TAB: 10 mg | 30 days supply | Qty: 12 | Fill #4 | Status: AC

## 2021-02-06 ENCOUNTER — Encounter: Admit: 2021-02-06 | Discharge: 2021-02-06 | Payer: BC Managed Care – PPO

## 2021-02-06 MED FILL — SERTRALINE 50 MG PO TAB: 50 mg | ORAL | 90 days supply | Qty: 90 | Fill #2 | Status: AC

## 2021-02-06 MED FILL — LEVOTHYROXINE 75 MCG PO TAB: 75 mcg | ORAL | 90 days supply | Qty: 90 | Fill #2 | Status: AC

## 2021-02-07 ENCOUNTER — Encounter: Admit: 2021-02-07 | Discharge: 2021-02-07 | Payer: BC Managed Care – PPO

## 2021-02-11 ENCOUNTER — Encounter: Admit: 2021-02-11 | Discharge: 2021-02-11 | Payer: BC Managed Care – PPO

## 2021-02-11 MED FILL — LIDOCAINE-PRILOCAINE 2.5-2.5 % TP CREA: TOPICAL | 30 days supply | Qty: 60 | Fill #1 | Status: AC

## 2021-02-21 ENCOUNTER — Encounter: Admit: 2021-02-21 | Discharge: 2021-02-21 | Payer: BC Managed Care – PPO

## 2021-02-22 ENCOUNTER — Encounter: Admit: 2021-02-22 | Discharge: 2021-02-22 | Payer: BC Managed Care – PPO

## 2021-02-22 MED FILL — RIZATRIPTAN 10 MG PO TAB: 10 mg | 30 days supply | Qty: 12 | Fill #5 | Status: AC

## 2021-02-28 ENCOUNTER — Encounter: Admit: 2021-02-28 | Discharge: 2021-02-28 | Payer: BC Managed Care – PPO

## 2021-02-28 DIAGNOSIS — R1031 Right lower quadrant pain: Secondary | ICD-10-CM

## 2021-03-02 ENCOUNTER — Encounter: Admit: 2021-03-02 | Discharge: 2021-03-02 | Payer: BC Managed Care – PPO

## 2021-03-02 ENCOUNTER — Ambulatory Visit: Admit: 2021-03-02 | Discharge: 2021-03-02 | Payer: BC Managed Care – PPO

## 2021-03-02 DIAGNOSIS — J387 Other diseases of larynx: Secondary | ICD-10-CM

## 2021-03-02 DIAGNOSIS — J381 Polyp of vocal cord and larynx: Secondary | ICD-10-CM

## 2021-03-02 NOTE — Progress Notes
[facility]  Voice Therapy [phone])  Staff: Trudee Grip, M.D.  Date: 03/02/2021      Kara Ponce?was referred for videostroboscopy with a diagnosis of dysphonia. Patient has been experiencing hoarseness for the past year. Past medical history includes?a vocal nodule in 2021 that resolved after voice rest?and vocal hygeine.?Patient saw Dr. Milus Banister on 10/26/20 who identified another vocal cord nodule.?She?returns to clinic today to work on the goals established during the voice evaluation session.?She?returns to clinic today to work on the goals established during the voice evaluation session.  ?  11/11/20: Patient comes to clinic today and reports that she is currently having raspy voice since taking a week off of work and using her voice more?during that time. She notices that it's raspy but not feeling as much strain. Currently doing exercises 2x per day, doing a lot of the m words on the way to work.?  ?  12/17/20: Patient feels like her voice is tired from working all week, voice rest did help her recovery. She is doing her exercises 2x per day. Feels like she has become more aware of when she is using poor technique.  ?  01/19/21: Voice feels pretty good, having a lot of allergies. Can tell when her voice needs rest, she feels strain. She feels that the exercises are helping but she did not do them the last week.     03/02/21: Saw Dr. Marisa Sprinkles and she said things look good. Voice feels fine, her voice still gets worn out and that is an indicator that she needs to rest. She has been focusing on reading at night with her kids.    O  Long-term goal:  Patient will exhibit optimal function for professional, personal, and social needs.    Short-term goals:     1.  Patient will increase understanding of normal phonation and abnormal phonation.  PARTIALLY MET   Patient identified abnormal voicing with >85%% accuracy given minimal assist and direct clinician modeling.  ?  2.  Patient will increase understanding of and compliance with vocal hygiene guidelines. Patient will identify good vocal hygiene given functional situations with greater than 90% accuracy.  PARTIALLY MET     3.  Patient will demonstrate diaphragmatic breathing during structured and conversational speech tasks with 90% accuracy.  PARTIALLY MET   Patient completed diaphragmatic breathing with 80% accuracy given minimal cues.    4.  Patient will participate in exercises specifically focused on reducing the degree of musculoskeletal tension with greater than 90% accuracy.  MET   Voiceless cup bubbling-?100% accuracy independently.  Voiced cup bubbling-?100% accuracy independently.  Humming-100% accuracy independently.  Humm + ahh-?100% independently.  ?  5 . Patient will demonstrate a front and forward focus while voicing with 90% accuracy during structured and conversational speech tasks.  PARTIALLY MET   Functional Phrases-?80% with progression from question form to statement form (variating pitch)  Reading- 80% with mod-max assist and direct modeling.  Conversation- 70% with mod assist and direct modeling.  A  MRS Mcelhinny demonstrates mild dysphonia characterized by glottal fry and strain.  Patient feels that her voice is continuing to make improvements.  She reports excellent compliance with recommendations provided in the previous session.  Based upon compliance, motivation and response to today's session, prognosis for continued improvement with therapy is excellent.    Key points of today's session:  -Practiced functional phrases, reading and conversation.   -Continue to focus on practicing the above.  P  Recommendations include for patient to continue to work toward excellent vocal hygiene and complete voice exercises at least twice daily.  She plans to return to clinic in 2-3 weeks to continue to work on the above goals.  MRS Mctier was encouraged to contact me with any further questions or concerns and left the clinic in excellent condition.    The ENT speech pathology service plans to continue to follow this patient in the future for any potential/ongoing outpatient voice, swallowing, articulation or trismus related care.

## 2021-03-26 ENCOUNTER — Encounter: Admit: 2021-03-26 | Discharge: 2021-03-26 | Payer: BC Managed Care – PPO

## 2021-03-26 DIAGNOSIS — G43009 Migraine without aura, not intractable, without status migrainosus: Secondary | ICD-10-CM

## 2021-03-26 MED ORDER — RIZATRIPTAN 10 MG PO TAB
ORAL_TABLET | 5 refills
Start: 2021-03-26 — End: ?

## 2021-03-28 ENCOUNTER — Encounter: Admit: 2021-03-28 | Discharge: 2021-03-28 | Payer: BC Managed Care – PPO

## 2021-03-28 NOTE — Telephone Encounter
Patient left message on nurse line requesting a prescription for compression stockings.  Prescription sent to patient's mychart per her request.

## 2021-03-29 MED FILL — RIZATRIPTAN 10 MG PO TAB: 10 mg | 25 days supply | Qty: 10 | Fill #1 | Status: AC

## 2021-03-31 ENCOUNTER — Encounter: Admit: 2021-03-31 | Discharge: 2021-03-31 | Payer: BC Managed Care – PPO

## 2021-04-04 ENCOUNTER — Ambulatory Visit: Admit: 2021-04-04 | Discharge: 2021-04-04 | Payer: BC Managed Care – PPO

## 2021-04-04 ENCOUNTER — Encounter: Admit: 2021-04-04 | Discharge: 2021-04-04 | Payer: BC Managed Care – PPO

## 2021-04-04 DIAGNOSIS — I83811 Varicose veins of right lower extremities with pain: Principal | ICD-10-CM

## 2021-04-04 MED ORDER — MIDWEST LIDOCAINE W/EPINEPHRIN 1%-SODIUM BICARBONATE 8.4%-NORMAL SALINE 0.9%
350 mL | Freq: Once | INTRAMUSCULAR | 0 refills | Status: CP
Start: 2021-04-04 — End: ?
  Administered 2021-04-04: 18:00:00 350 mL via INTRAMUSCULAR

## 2021-04-04 MED ORDER — LIDOCAINE (PF) 10 MG/ML (1 %) IJ SOLN
4 mL | Freq: Once | INTRAMUSCULAR | 0 refills | Status: CP
Start: 2021-04-04 — End: ?
  Administered 2021-04-04: 18:00:00 4 mL via INTRAMUSCULAR

## 2021-04-04 NOTE — Patient Instructions
[facility]  [address]  [address], [address]  [place], [place] [address]  [phone]  Fax [phone]      Instructions following Vein Procedure    If you have bleeding at home after your procedure that seeps through the ace bandage, you may apply pressure to the area, elevate the leg, also apply ice as needed.  Please also feel free to contact our office in this case    You may remove the compression wrap/stocking and bandages in 24 hours and start using your compression stocking.  Do not be surprised to see drainage on the white gauze pads, this is to be expected.     You may shower after removing the bandages in 24 hours, leaving steri-strips in place.      You may remove the steri-strips in 7 days, it is ok if they fall off before 7 days.    Wear your compression stocking day and night for 7 days, then daily for 3 weeks.  After the initial weeks, it is beneficial to wear a knee high compression stocking for prevention of future vein disease.    Please avoid hot tubs, baths, pools and lakes for one week.    Please avoid long travel (i.e. plane or car) for one week.     Activity:  Walking is OK and encouraged starting the same day as the procedure.  No lower body work out for one week (i.e. running/ weight training)    You are strongly encouraged to take an anti-inflammatory such as Naprosyn (Aleve) twice daily, or Ibuprofen (Motrin, Advil) 400-600 mg three time daily, for at least the first 3 days following your procedure.  Some discomfort post procedure is normal, however if you have prolonged pain that is interfering with your daily activities, please contact our office.     Your follow up appointment has been scheduled with your doctor.  Please call to reschedule if your given time does not work        Please call if you have any questions.    Kelsey Hicks, RN-BSN was your nurse today    Jenny G Cho, MD was your doctor today.

## 2021-04-04 NOTE — Procedures
RADIOFREQUENCY ABLATION w/MP     Diagnosis: The encounter diagnosis was Varicose veins of right lower extremity with pain.    Operation(s):  1. Endovenous radiofrequency ablation of the right great saphenous vein requiring single site microaccess.    2. Microphlebectomy right leg greater than 21 , Total of 50 varicose veins in the medial groin, medial, lateral thigh, anterior knee, medial, lateral, posterior calf and lateral ankle.    Surgeon: Delana Meyer, MD      Assistant(s):     Marco Collie, R.N.-B.S.N., Roanna Epley, M.A. Patient Care Coordinator/ORA and Milana Obey M.A. Patient Care Coordinator/ORA                          Medications: Emla cream - 30 grams    Procedure start time: 1022    Procedure: Varicose clusters were then marked with the patient standing. The patient was placed in the supine position on the operating table, the leg prepped with Chloroprep and sterile drapes applied.    A time out was performed with provider, medical assistant, nurse, and patient verifying correct patient, correct procedure and correct site at  1022.    The patient was placed in reverse-Trendelenburg position and local anesthesia was instilled in the skin overlying the access site.  The great saphenous vein at the level of the knee was punctured through the skin and using ultrasound guidance a guide wire was introduced through the needle which was then exchanged over the guide wire for a 64F sheath.  The guide wire was removed.  The RF probe was placed into the vein through the sheath and positioned 3.5cm  distal to the saphenofemoral junction measured with ultrasound caliper.    After the RF probe position was verified by ultrasound, tumescent anesthesia (50 mL 1% lidocaine with epinephrine, 5 mL 8.4% sodium bicarbonate in 500 mL of 0.9% normal saline)  was infiltrated, under ultrasound guidance, precisely into the perivenous compartment along the entire length of vein from the entry site to the saphenofemoral junction until a halo of fluid was noted around the vein.      The patient was then placed in Trendelenburg position and the superficial venous system was exsanguinated. After RF probe position was again confirmed with ultrasound imaging, RF energy was applied twice to each segment treatment..  The probe was withdrawn at a rate of approximately 6.5 cm at 20 second intervals and monitored to keep the vein wall temperature within 110 ? 5? C and the generator output well below its 40-watt maximum power.  Total treatment  7 cycles, 32 cm, Total Tumescent , Total 1% lidocaine 5ml.    Ablation of the great saphenous vein was completed with radiofrequency ablation.  The sheath and ablation catheter were then removed.  Duplex imaging confirmed the endovascular venous ablation was successful, as it showed no flow present in the treated vein(s) and normal flow in the femoral vein.    Microphlebectomy was then performed.  Tumescent anesthetic was infiltrated along the previously marked varicose clusters.  An 18-gauge needle was used to gain entry through the skin with a #1 Oesch vein hook.  The vein(s) were grasped with mosquito clamps and teased out.  Hemostasis was obtained by direct pressure application.  greater than 21  micro-incisions were required 50. Steri-strips were placed A compression dressing was applied with dry 4 x 4 gauze dressing and an elastic compression wrap.  Extra padding was applied along the lateral knee to  prevent peroneal nerve compression injury.  No immediate procedural complications noted.  The patient tolerated the procedure well and was fully ambulatory after the procedure and left the facility in good condition. Post procedure instructions were given.    Procedure end time: 1109    Delana Meyer, MD                     Date: 04/04/2021

## 2021-04-05 ENCOUNTER — Encounter: Admit: 2021-04-05 | Discharge: 2021-04-05 | Payer: BC Managed Care – PPO

## 2021-04-05 ENCOUNTER — Ambulatory Visit: Admit: 2021-04-05 | Discharge: 2021-04-05 | Payer: BC Managed Care – PPO

## 2021-04-05 DIAGNOSIS — I83812 Varicose veins of left lower extremities with pain: Secondary | ICD-10-CM

## 2021-04-05 DIAGNOSIS — Z8619 Personal history of other infectious and parasitic diseases: Secondary | ICD-10-CM

## 2021-04-05 DIAGNOSIS — F419 Anxiety disorder, unspecified: Secondary | ICD-10-CM

## 2021-04-05 DIAGNOSIS — E039 Hypothyroidism, unspecified: Secondary | ICD-10-CM

## 2021-04-05 DIAGNOSIS — Z8669 Personal history of other diseases of the nervous system and sense organs: Secondary | ICD-10-CM

## 2021-04-05 DIAGNOSIS — I83811 Varicose veins of right lower extremities with pain: Secondary | ICD-10-CM

## 2021-04-05 MED ORDER — MIDWEST LIDOCAINE W/EPINEPHRIN 1%-SODIUM BICARBONATE 8.4%-NORMAL SALINE 0.9%
100 mL | Freq: Once | INTRAMUSCULAR | 0 refills | Status: CP
Start: 2021-04-05 — End: ?
  Administered 2021-04-05: 19:00:00 100 mL via INTRAMUSCULAR

## 2021-04-05 NOTE — Patient Instructions
[facility]  [address]  [address], [address]  [place], [place] [address]  [phone]  Fax [phone]      Instructions following Vein Procedure    If you have bleeding at home after your procedure that seeps through the ace bandage, you may apply pressure to the area, elevate the leg, also apply ice as needed.  Please also feel free to contact our office in this case    You may remove the compression wrap/stocking and bandages in 24 hours and start using your compression stocking.  Do not be surprised to see drainage on the white gauze pads, this is to be expected.     You may shower after removing the bandages in 24 hours, leaving steri-strips in place.      You may remove the steri-strips in 7 days, it is ok if they fall off before 7 days.    Wear your compression stocking day and night for 7 days, then daily for 3 weeks.  After the initial weeks, it is beneficial to wear a knee high compression stocking for prevention of future vein disease.    Please avoid hot tubs, baths, pools and lakes for one week.    Please avoid long travel (i.e. plane or car) for one week.     Activity:  Walking is OK and encouraged starting the same day as the procedure.  No lower body work out for one week (i.e. running/ weight training)    You are strongly encouraged to take an anti-inflammatory such as Naprosyn (Aleve) twice daily, or Ibuprofen (Motrin, Advil) 400-600 mg three time daily, for at least the first 3 days following your procedure.  Some discomfort post procedure is normal, however if you have prolonged pain that is interfering with your daily activities, please contact our office.     Your follow up appointment has been scheduled with your doctor.  Please call to reschedule if your given time does not work        Please call if you have any questions.    Kelsey Hicks, RN-BSN was your nurse today    Jenny G Cho, MD was your doctor today.

## 2021-04-05 NOTE — Procedures
MICROPHLEBECTOMY    Diagnosis: The encounter diagnosis was Varicose veins of left lower extremity with pain.    Operation(s): Microphlebectomy: bilateral leg 10-20 Total: 13 varicose veins in the right posterior thigh/knee, left medial/posterior calf and medial knee.      Surgeon: Delana Meyer, MD     Assistant(s):   Marco Collie, R.N.-B.S.N. and Milana Obey M.A. Patient Care Coordinator/ORA    Medications: Emla cream - 30 grams    Procedure start time: 1240    Procedure:  Varicose clusters were then marked with the patient standing.  The patient was placed in the supine position on the operating table, the leg prepped with Chloroprep and sterile drapes applied.    A time out was performed with provider, medical assistant, nurse, and patient verifying correct patient, correct procedure and correct site at 1241.     Microphlebectomy was then performed.  100 cc of tumescent anesthesia (50 mL 1% lidocaine with epinephrine, 5 mL 8.4% sodium bicarbonate in 500 mL of 0.9% normal saline) was infiltrated along the previously marked varicose clusters.  An 18-gauge needle was used to gain entry through the skin with a #1 Oesch vein hook.  The vein(s) were grasped with mosquito clamps and teased out.  Hemostasis was obtained by direct pressure application.       bilateral 10-20 Total: 13 micro-incisions were required. Steri-strips were placed.  A compression dressing was applied with dry 4 x 4 gauze dressing and an elastic compression wrap.  Extra padding was applied along the lateral knee to prevent peroneal nerve compression injury.  No immediate procedural complications noted.  The patient tolerated the procedure well and was fully ambulatory after the procedure and left the facility in good condition. Post procedure instructions were given.    Procedure end time: 1255    Delana Meyer, MD                                      Date: 04/05/2021

## 2021-04-06 ENCOUNTER — Ambulatory Visit: Admit: 2021-04-06 | Discharge: 2021-04-06 | Payer: BC Managed Care – PPO

## 2021-04-06 ENCOUNTER — Encounter: Admit: 2021-04-06 | Discharge: 2021-04-06 | Payer: BC Managed Care – PPO

## 2021-04-06 DIAGNOSIS — I83811 Varicose veins of right lower extremities with pain: Secondary | ICD-10-CM

## 2021-04-13 ENCOUNTER — Encounter: Admit: 2021-04-13 | Discharge: 2021-04-13 | Payer: BC Managed Care – PPO

## 2021-04-22 ENCOUNTER — Encounter: Admit: 2021-04-22 | Discharge: 2021-04-22 | Payer: BC Managed Care – PPO

## 2021-04-25 ENCOUNTER — Encounter: Admit: 2021-04-25 | Discharge: 2021-04-25 | Payer: BC Managed Care – PPO

## 2021-04-26 ENCOUNTER — Encounter: Admit: 2021-04-26 | Discharge: 2021-04-26 | Payer: BC Managed Care – PPO

## 2021-04-27 MED FILL — RIZATRIPTAN 10 MG PO TAB: 10 mg | 60 days supply | Qty: 18 | Fill #2 | Status: AC

## 2021-05-15 ENCOUNTER — Encounter: Admit: 2021-05-15 | Discharge: 2021-05-15 | Payer: BC Managed Care – PPO

## 2021-05-16 MED FILL — LEVOTHYROXINE 75 MCG PO TAB: 75 mcg | ORAL | 90 days supply | Qty: 90 | Fill #3 | Status: AC

## 2021-05-16 MED FILL — SERTRALINE 50 MG PO TAB: 50 mg | ORAL | 90 days supply | Qty: 90 | Fill #3 | Status: AC

## 2021-06-13 ENCOUNTER — Encounter: Admit: 2021-06-13 | Discharge: 2021-06-13 | Payer: BC Managed Care – PPO

## 2021-06-13 MED FILL — RIZATRIPTAN 10 MG PO TAB: 10 mg | 60 days supply | Qty: 18 | Fill #3 | Status: AC

## 2021-06-20 ENCOUNTER — Encounter: Admit: 2021-06-20 | Discharge: 2021-06-20 | Payer: BC Managed Care – PPO

## 2021-06-20 MED ORDER — AMOXICILLIN 500 MG PO TAB
500 mg | ORAL_TABLET | Freq: Three times a day (TID) | ORAL | 0 refills | 7.00000 days | Status: AC
Start: 2021-06-20 — End: ?
  Filled 2021-06-20: qty 21, 7d supply, fill #1

## 2021-06-25 ENCOUNTER — Encounter: Admit: 2021-06-25 | Discharge: 2021-06-25 | Payer: BC Managed Care – PPO

## 2021-06-25 MED ORDER — HYDROQUINONE 4 % TP CREA
Freq: Two times a day (BID) | TOPICAL | 3 refills | Status: AC
Start: 2021-06-25 — End: ?
  Filled 2021-07-01: qty 56.7, 30d supply, fill #1

## 2021-06-28 ENCOUNTER — Encounter: Admit: 2021-06-28 | Discharge: 2021-06-28 | Payer: BC Managed Care – PPO

## 2021-06-29 ENCOUNTER — Encounter: Admit: 2021-06-29 | Discharge: 2021-06-29 | Payer: BC Managed Care – PPO

## 2021-06-30 ENCOUNTER — Encounter: Admit: 2021-06-30 | Discharge: 2021-06-30 | Payer: BC Managed Care – PPO

## 2021-07-01 ENCOUNTER — Encounter: Admit: 2021-07-01 | Discharge: 2021-07-01 | Payer: BC Managed Care – PPO

## 2021-07-08 ENCOUNTER — Encounter: Admit: 2021-07-08 | Discharge: 2021-07-08 | Payer: BC Managed Care – PPO

## 2021-07-08 NOTE — Progress Notes
Patient Name:  Kara Ponce  MRN:  [id number]  DOB:  1985/06/21  Insurance:  [place] [employer] [place] / [place] [place]UE [place] PLUS / Product Type: PPO /          Reason for Visit:  Post-op   Diagnosis: Veins      Physician Info:    Referring Physician:  No ref. provider found    PCP:  Neill Loft B      History of Present Illness:  Patient is a 36 year old female s/p endovenous ablation of right great saphenous vein and bilateral microphlebectomy performed by Dr. Theodoro Grist on 11/14 and 04/05/2021.      Scheduled testing:       Location of Films:  IN HOUSE    Date: Imaging: Impression:   04/06/21 Post-ablation right Successful ablation, negative HIT                    Medical History   has a past medical history of Anxiety, History of chicken pox, History of migraine headaches, and Hypothyroidism.          Surgical History  has a past surgical history that includes wisdom teeth extraction (05/23/2003); Cystoscopy (08/13/2009); Sigmoidoscopy (N/A, 12/26/2017); Nerve Surgery (Right, 01/14/2018); and microphlebectomy (Left, 04/05/2021).    Allergies   Allergies   Allergen Reactions    Sulfa (Sulfonamide Antibiotics) HIVES          Medications:        diclofenac (VOLTAREN) 1 % topical gel Apply four g topically to affected area four times daily.    hydroquinone (ELDOQUIN FORTE) 4 % cream Apply  topically to affected area twice daily.    levothyroxine (SYNTHROID) 75 mcg tablet Take one tablet by mouth daily.    lidocaine/prilocaine (EMLA) 2.5/2.5 % topical cream Apply  topically to affected area as directed. Apply cream to entire leg of normal intact skin two hours prior to procedure    rizatriptan (MAXALT) 10 mg tablet Take one tablet by mouth at onset of headache. May repeat after 2 hours. Max of 30 mg in 24 hours.    sertraline (ZOLOFT) 50 mg tablet Take one tablet by mouth daily.           Creatinine:     Comments:

## 2021-08-01 ENCOUNTER — Encounter: Admit: 2021-08-01 | Discharge: 2021-08-01 | Payer: BC Managed Care – PPO

## 2021-08-19 ENCOUNTER — Encounter: Admit: 2021-08-19 | Discharge: 2021-08-19 | Payer: BC Managed Care – PPO

## 2021-08-20 MED FILL — LEVOTHYROXINE 75 MCG PO TAB: 75 mcg | ORAL | 90 days supply | Qty: 90 | Fill #4 | Status: AC

## 2021-08-20 MED FILL — RIZATRIPTAN 10 MG PO TAB: 10 mg | 30 days supply | Qty: 12 | Fill #4 | Status: AC

## 2021-08-20 MED FILL — SERTRALINE 50 MG PO TAB: 50 mg | ORAL | 90 days supply | Qty: 90 | Fill #4 | Status: AC

## 2021-08-25 ENCOUNTER — Ambulatory Visit: Admit: 2021-08-25 | Discharge: 2021-08-26 | Payer: BC Managed Care – PPO

## 2021-08-25 ENCOUNTER — Encounter: Admit: 2021-08-25 | Discharge: 2021-08-25 | Payer: BC Managed Care – PPO

## 2021-08-25 DIAGNOSIS — E039 Hypothyroidism, unspecified: Secondary | ICD-10-CM

## 2021-08-25 DIAGNOSIS — Z8669 Personal history of other diseases of the nervous system and sense organs: Secondary | ICD-10-CM

## 2021-08-25 DIAGNOSIS — Z8619 Personal history of other infectious and parasitic diseases: Secondary | ICD-10-CM

## 2021-08-25 DIAGNOSIS — F419 Anxiety disorder, unspecified: Secondary | ICD-10-CM

## 2021-08-25 NOTE — Patient Instructions
Our phone number is [phone].

## 2021-09-09 ENCOUNTER — Ambulatory Visit: Admit: 2021-09-09 | Discharge: 2021-09-09 | Payer: BC Managed Care – PPO

## 2021-09-09 ENCOUNTER — Encounter: Admit: 2021-09-09 | Discharge: 2021-09-09 | Payer: BC Managed Care – PPO

## 2021-09-09 DIAGNOSIS — R1031 Right lower quadrant pain: Secondary | ICD-10-CM

## 2021-09-15 ENCOUNTER — Encounter: Admit: 2021-09-15 | Discharge: 2021-09-15 | Payer: BC Managed Care – PPO

## 2021-09-15 DIAGNOSIS — R1031 Right lower quadrant pain: Secondary | ICD-10-CM

## 2021-10-14 ENCOUNTER — Encounter: Admit: 2021-10-14 | Discharge: 2021-10-14 | Payer: BC Managed Care – PPO

## 2021-10-31 ENCOUNTER — Encounter: Admit: 2021-10-31 | Discharge: 2021-10-31 | Payer: BC Managed Care – PPO

## 2021-10-31 DIAGNOSIS — G43009 Migraine without aura, not intractable, without status migrainosus: Secondary | ICD-10-CM

## 2021-10-31 DIAGNOSIS — F419 Anxiety disorder, unspecified: Secondary | ICD-10-CM

## 2021-10-31 DIAGNOSIS — E039 Hypothyroidism, unspecified: Secondary | ICD-10-CM

## 2021-10-31 MED ORDER — SERTRALINE 50 MG PO TAB
50 mg | ORAL_TABLET | Freq: Every day | ORAL | 0 refills | Status: AC
Start: 2021-10-31 — End: ?
  Filled 2021-11-01: qty 90, 90d supply, fill #1

## 2021-10-31 MED ORDER — RIZATRIPTAN 10 MG PO TAB
ORAL_TABLET | 5 refills | Status: AC
Start: 2021-10-31 — End: ?
  Filled 2021-11-03: qty 10, 10d supply, fill #1

## 2021-10-31 MED ORDER — LEVOTHYROXINE 75 MCG PO TAB
75 ug | ORAL_TABLET | Freq: Every day | ORAL | 0 refills | 30.00000 days | Status: AC
Start: 2021-10-31 — End: ?
  Filled 2021-11-01: qty 90, 90d supply, fill #1

## 2021-11-01 ENCOUNTER — Encounter: Admit: 2021-11-01 | Discharge: 2021-11-01 | Payer: BC Managed Care – PPO

## 2021-11-02 ENCOUNTER — Encounter: Admit: 2021-11-02 | Discharge: 2021-11-02 | Payer: BC Managed Care – PPO

## 2021-11-03 ENCOUNTER — Encounter: Admit: 2021-11-03 | Discharge: 2021-11-03 | Payer: BC Managed Care – PPO

## 2021-11-16 ENCOUNTER — Ambulatory Visit: Admit: 2021-11-16 | Discharge: 2021-11-17 | Payer: BC Managed Care – PPO

## 2021-11-16 ENCOUNTER — Encounter: Admit: 2021-11-16 | Discharge: 2021-11-16 | Payer: BC Managed Care – PPO

## 2021-11-16 DIAGNOSIS — F419 Anxiety disorder, unspecified: Secondary | ICD-10-CM

## 2021-11-16 DIAGNOSIS — Z8619 Personal history of other infectious and parasitic diseases: Secondary | ICD-10-CM

## 2021-11-16 DIAGNOSIS — E039 Hypothyroidism, unspecified: Secondary | ICD-10-CM

## 2021-11-16 DIAGNOSIS — R350 Frequency of micturition: Secondary | ICD-10-CM

## 2021-11-16 DIAGNOSIS — Z8669 Personal history of other diseases of the nervous system and sense organs: Secondary | ICD-10-CM

## 2021-11-16 MED ORDER — NITROFURANTOIN MONOHYD/M-CRYST 100 MG PO CAP
100 mg | ORAL_CAPSULE | Freq: Two times a day (BID) | ORAL | 0 refills | 7.00000 days | Status: AC
Start: 2021-11-16 — End: ?

## 2021-11-16 NOTE — Patient Instructions
Macrobid, as directed  Increase water intake  If symptoms worsen or do not improve in 3 days seek treatment with primary care for urine testing. If you develop a fever or flank pain seek treatment at the ER. Advised cannot confirm UTI and elected to be treated based on severity of symptoms.

## 2021-11-16 NOTE — Progress Notes
Telehealth Visit Note    Date of Service: 11/16/2021    Subjective:           Kara Ponce is a 36 y.o. female.  Chief Complaint   Patient presents with   ? Urinary Pain     X 2d    ? Urinary Frequency       Urinary Pain   This is a new problem. The current episode started yesterday. The problem occurs every urination. The problem has been gradually worsening. The quality of the pain is described as aching. The pain is mild. There has been no fever. There is no history of pyelonephritis. Associated symptoms include frequency and urgency. Pertinent negatives include no chills, discharge, flank pain, hematuria, hesitancy, nausea, possible pregnancy, sweats or vomiting. She has tried increased fluids for the symptoms. The treatment provided no relief. There is no history of catheterization, kidney stones or recurrent UTIs.     Slight dysuria and urinary frequency started last night  Denies fever, flank pain or hematuria  Denies pregnancy      Review of Systems   Constitutional: Negative for chills and fever.   HENT: Negative for congestion, rhinorrhea and sore throat.    Respiratory: Negative for cough and shortness of breath.    Cardiovascular: Negative for chest pain.   Gastrointestinal: Negative for abdominal pain, diarrhea, nausea and vomiting.   Genitourinary: Positive for dysuria, frequency and urgency. Negative for difficulty urinating, flank pain, hematuria and hesitancy.   Musculoskeletal: Negative for myalgias.   Neurological: Negative for weakness, light-headedness and headaches.       .  Objective:         ? hydroquinone (ELDOQUIN FORTE) 4 % cream Apply  topically to affected area twice daily.   ? levothyroxine (SYNTHROID) 75 mcg tablet Take one tablet by mouth daily.   ? lidocaine/prilocaine (EMLA) 2.5/2.5 % topical cream Apply  topically to affected area as directed. Apply cream to entire leg of normal intact skin two hours prior to procedure   ? nitrofurantoin monohyd/m-cryst (MACROBID) 100 mg capsule Take one capsule by mouth every 12 hours for 5 days. Take with food.  Indications: genitourinary tract infections   ? rizatriptan (MAXALT) 10 mg tablet Take one tablet by mouth at onset of headache. May repeat after 2 hours. Max of 30 mg in 24 hours.   ? sertraline (ZOLOFT) 50 mg tablet Take one tablet by mouth daily.           Computed Telehealth Body Mass Index unavailable. Necessary lab results were not found in the last year.    Physical Exam  Constitutional:       General: She is not in acute distress.     Appearance: She is not ill-appearing, toxic-appearing or diaphoretic.   HENT:      Head: Normocephalic and atraumatic.      Right Ear: External ear normal.      Left Ear: External ear normal.   Eyes:      Extraocular Movements: Extraocular movements intact.      Conjunctiva/sclera: Conjunctivae normal.   Pulmonary:      Effort: Pulmonary effort is normal. No respiratory distress.   Abdominal:      Tenderness: There is no abdominal tenderness. There is no right CVA tenderness or left CVA tenderness.   Musculoskeletal:      Cervical back: Normal range of motion. No rigidity.   Neurological:      Mental Status: She is alert.  Assessment and Plan:  Kara Ponce was seen today for urinary pain and urinary frequency.    Diagnoses and all orders for this visit:    Urinary frequency    Other orders  -     nitrofurantoin monohyd/m-cryst (MACROBID) 100 mg capsule; Take one capsule by mouth every 12 hours for 5 days. Take with food.  Indications: genitourinary tract infections            Patient Instructions   Macrobid, as directed  Increase water intake  If symptoms worsen or do not improve in 3 days seek treatment with primary care for urine testing. If you develop a fever or flank pain seek treatment at the ER. Advised cannot confirm UTI and elected to be treated based on severity of symptoms.                         minutes spent on this patient's encounter with counseling and coordination of care taking >50% of the visit.

## 2021-11-22 ENCOUNTER — Encounter: Admit: 2021-11-22 | Discharge: 2021-11-22 | Payer: BC Managed Care – PPO

## 2021-11-22 ENCOUNTER — Ambulatory Visit: Admit: 2021-11-22 | Discharge: 2021-11-22 | Payer: BC Managed Care – PPO

## 2021-11-22 DIAGNOSIS — E039 Hypothyroidism, unspecified: Secondary | ICD-10-CM

## 2021-11-22 DIAGNOSIS — R52 Pain, unspecified: Secondary | ICD-10-CM

## 2021-11-22 DIAGNOSIS — Z8669 Personal history of other diseases of the nervous system and sense organs: Secondary | ICD-10-CM

## 2021-11-22 DIAGNOSIS — F419 Anxiety disorder, unspecified: Secondary | ICD-10-CM

## 2021-11-22 DIAGNOSIS — Z8619 Personal history of other infectious and parasitic diseases: Secondary | ICD-10-CM

## 2021-11-22 MED ORDER — CEPHALEXIN 500 MG PO CAP
500 mg | ORAL_CAPSULE | Freq: Two times a day (BID) | ORAL | 0 refills | Status: AC
Start: 2021-11-22 — End: ?

## 2021-11-22 NOTE — Patient Instructions
Please follow-up with your primary care in 5 days.     Please check your MyChart results for your urinary culture.  If you should develop any increasing symptoms over the next couple of days please go ahead and start antibiotic and take the full course.  If your symptoms resolved please hold off on the antibiotics.  If you have concerns about your urinary culture results please call or offices.      It is important that you return to the urgent care or follow-up with the emergency department if you develop any increased pain, fevers, difficulty breathing, vomiting or diarrhea.  Please take the full course of any medications given to you unless these are as needed.     Please check MyChart for your results which may take up to 72 hours.   We will send a MyChart results message but attempt to call you if you still need to sign up for MyChart.   Information to sign up to MyChart is on your After Visit Summary (AVS)  The MyChart help number is [phone], and the nurse triage number is [phone].

## 2021-11-22 NOTE — Progress Notes
Kara Ponce is a 36 y.o. female.    Chief Complaint:  Chief Complaint   Patient presents with   ? Urinary Retention     Macrobid complete, still fullness.        History of Present Illness:  Patient is a 36 year old female who presents to the urgent care with a complaint of dysuria.  Patient reported that she was recently seen in a virtual health environment for urinary tract infection where she was positive for leukocytes on her urine dip.  She was treated with Macrobid for 5 days and then continues now to have dysuria without frequency, abdominal pain, or back pain.  She also denies any nausea, vomiting, diarrhea, or fevers.        Review of Systems:  Review of Systems   Constitutional: Negative.    HENT: Negative.    Eyes: Negative.    Respiratory: Negative.    Cardiovascular: Negative.    Gastrointestinal: Negative.    Genitourinary: Positive for dysuria. Negative for flank pain, frequency and hematuria.   Musculoskeletal: Negative.    Skin: Negative.        Allergies:  Allergies   Allergen Reactions   ? Sulfa (Sulfonamide Antibiotics) HIVES       Past Medical History:  Medical History:   Diagnosis Date   ? Anxiety    ? History of chicken pox    ? History of migraine headaches    ? Hypothyroidism        Past Surgical History:  Surgical History:   Procedure Laterality Date   ? HX WISDOM TEETH EXTRACTION  05/23/2003   ? CYSTOSCOPY  08/13/2009    NORMAL   ? rectal nodule N/A 12/26/2017    Performed by Tempie Hoist, DO at [facility]   ? Right cubital tunnel release, possible transposition Right 01/14/2018    Performed by Desiree Hane, MD at [facility] OR   ? ANORECTAL MANOMETRY N/A 02/15/2018    Performed by Eliott Nine, MD at [facility]   ? HX MICROPHLEBECTOMY Left 04/05/2021    left leg microphlebectomy- Dr. Theodoro Grist       Pertinent medical/surgical history reviewed    History obtained from patient.    Social History:  Social History     Tobacco Use   ? Smoking status: Never   ? Smokeless tobacco: Never Substance Use Topics   ? Alcohol use: No     Alcohol/week: 1.0 - 2.0 standard drink of alcohol     Types: 1 - 2 Standard drinks or equivalent per week   ? Drug use: No     Social History     Substance and Sexual Activity   Drug Use No             Family History:  Family History   Problem Relation Age of Onset   ? Hypertension Mother    ? Thyroid Disease Mother    ? Heart Disease Mother    ? Hypertension Father    ? Stroke Father    ? Hearing Loss Father    ? High Cholesterol Father    ? Diabetes Maternal Grandfather    ? Heart Disease Paternal Grandmother    ? Heart Disease Paternal Grandfather    ? Anesthetic Complication Neg Hx    ? Cancer-Breast Neg Hx    ? Cancer-Colon Neg Hx    ? Cancer-Ovarian Neg Hx    ? Cancer-Uterine Neg Hx    ? Bleeding Disorders  Neg Hx    ? VTE Neg Hx    ? Cancer Neg Hx          ? cephalexin (KEFLEX) 500 mg capsule Take one capsule by mouth twice daily for 5 days.   ? hydroquinone (ELDOQUIN FORTE) 4 % cream Apply  topically to affected area twice daily.   ? levothyroxine (SYNTHROID) 75 mcg tablet Take one tablet by mouth daily.   ? lidocaine/prilocaine (EMLA) 2.5/2.5 % topical cream Apply  topically to affected area as directed. Apply cream to entire leg of normal intact skin two hours prior to procedure   ? rizatriptan (MAXALT) 10 mg tablet Take one tablet by mouth at onset of headache. May repeat after 2 hours. Max of 30 mg in 24 hours.   ? sertraline (ZOLOFT) 50 mg tablet Take one tablet by mouth daily.       Vitals:  Vitals:    11/22/21 1100   BP: 112/58   BP Source: Arm, Left Upper   Pulse: 78   Temp: 36.9 ?C (98.5 ?F)   Resp: 14   SpO2: 98%   TempSrc: Oral   Weight: 66.8 kg (147 lb 3.2 oz)   Height: 160 cm (5' 3)        Physical Exam:  Physical Exam  Vitals and nursing note reviewed.   HENT:      Head: Normocephalic and atraumatic.   Cardiovascular:      Rate and Rhythm: Regular rhythm.   Pulmonary:      Effort: Pulmonary effort is normal.   Abdominal:      General: Abdomen is flat. Bowel sounds are normal.      Palpations: Abdomen is soft.   Musculoskeletal:         General: Normal range of motion.   Neurological:      Mental Status: She is alert.         Laboratory Results:     Results for orders placed or performed in visit on 11/22/21 (from the past 336 hour(s))   POC URINE DIPSTICK AUTO READ   Result Value Ref Range    Urine Glucose POC Negative Negative    Urine Bilirubin POC Negative Negative    Urine Ketone POC Negative Negative    Urine Specific Gravity POC 1.020 1.003 - 1.035    Urine Blood POC Negative Negative    Urine PH POC 7 5 - 8    Urine Protein POC Negative Negative    Urine Urobilinogen POC 0.2 0.2, 1.0    Urine Nitrite POC Negative Negative    Urine Leukocytes POC Negative Negative    Urine Color POC Yellow Yellow, Colorless, Pale Yellow, Amber    Urine Turbidity POC Slightly Cloudy (A) Clear   POC URINE PREGNANCY   Result Value Ref Range    Urine Pregnancy POC Negative     POC Urine Pregnancy Quality Control Pass     Lot Number [facility]         Radiology Interpretation:      EKG:    Facility Administered Meds:               Clinical Impression:  Kara Ponce is a 36 y.o. female who presents with dysuria after being treated with Macrobid for urinary tract infection.  Patient urine dip was negative in the urgent care but because of her continued symptoms a urine culture was sent off.  A prescription was placed in the system for Keflex if she continues  to have increasing symptoms and if her culture comes positive.  Patient is allergic to sulfa which gives her hives therefore Bactrim was not used and she was recently treated with Macrobid.  She understands to start her antibiotic if her symptoms increased but to try continuing with cranberry juice and hydration over the next couple of days while she waits for her culture to come back.  She will look at MyChart for culture results and give [place] a call if she has any questions or concerns.  She is told that we will also give her a call if her culture is positive.  Patient understand the discharge instructions and had no further questions.    Disposition/Follow up       1. Pain  POC URINE DIPSTICK AUTO READ    POC URINE PREGNANCY    CULTURE-URINE W/SENSITIVITY          Medications:  Encounter Medications   Medications   ? cephalexin (KEFLEX) 500 mg capsule     Sig: Take one capsule by mouth twice daily for 5 days.     Dispense:  10 capsule     Refill:  0         Procedure Notes:      Patient Instructions:    Patient Instructions   Please follow-up with your primary care in 5 days.     Please check your MyChart results for your urinary culture.  If you should develop any increasing symptoms over the next couple of days please go ahead and start antibiotic and take the full course.  If your symptoms resolved please hold off on the antibiotics.  If you have concerns about your urinary culture results please call or offices.      It is important that you return to the urgent care or follow-up with the emergency department if you develop any increased pain, fevers, difficulty breathing, vomiting or diarrhea.  Please take the full course of any medications given to you unless these are as needed.     Please check MyChart for your results which may take up to 72 hours.   We will send a MyChart results message but attempt to call you if you still need to sign up for MyChart.   Information to sign up to MyChart is on your After Visit Summary (AVS)  The MyChart help number is [phone], and the nurse triage number is [phone].            Kara Ponce Charlean Sanfilippo, MD

## 2021-11-25 ENCOUNTER — Encounter: Admit: 2021-11-25 | Discharge: 2021-11-25 | Payer: BC Managed Care – PPO

## 2021-11-25 DIAGNOSIS — E039 Hypothyroidism, unspecified: Secondary | ICD-10-CM

## 2021-11-25 DIAGNOSIS — Z0000 Encounter for general adult medical examination without abnormal findings: Secondary | ICD-10-CM

## 2021-11-27 ENCOUNTER — Encounter: Admit: 2021-11-27 | Discharge: 2021-11-27 | Payer: BC Managed Care – PPO

## 2021-11-29 ENCOUNTER — Ambulatory Visit: Admit: 2021-11-29 | Discharge: 2021-11-29 | Payer: BC Managed Care – PPO

## 2021-11-29 ENCOUNTER — Encounter: Admit: 2021-11-29 | Discharge: 2021-11-29 | Payer: BC Managed Care – PPO

## 2021-11-29 DIAGNOSIS — Z0000 Encounter for general adult medical examination without abnormal findings: Secondary | ICD-10-CM

## 2021-11-29 DIAGNOSIS — E039 Hypothyroidism, unspecified: Secondary | ICD-10-CM

## 2021-11-29 LAB — URINALYSIS MICROSCOPIC REFLEX TO CULTURE

## 2021-11-29 LAB — COMPREHENSIVE METABOLIC PANEL
POTASSIUM: 4 MMOL/L (ref 3.5–5.1)
SODIUM: 141 MMOL/L (ref 137–147)
TOTAL PROTEIN: 6.8 g/dL (ref 6.0–8.0)

## 2021-11-29 LAB — LIPID PROFILE
CHOLESTEROL: 172 mg/dL (ref <200)
HDL: 64 mg/dL (ref >40)
LDL: 107 mg/dL — ABNORMAL HIGH (ref <100)
TRIGLYCERIDES: 65 mg/dL (ref <150)

## 2021-11-29 LAB — CBC AND DIFF
ABSOLUTE BASO COUNT: 0 K/UL (ref 0–0.20)
ABSOLUTE MONO COUNT: 0.3 K/UL (ref 0–0.80)
RBC COUNT: 4.9 M/UL (ref 4.0–5.0)
WBC COUNT: 4.4 K/UL — ABNORMAL LOW (ref 4.5–11.0)

## 2021-11-29 LAB — URINALYSIS DIPSTICK REFLEX TO CULTURE
NITRITE: NEGATIVE K/UL (ref 0–0.45)
PROTEIN,UA: NEGATIVE U/L (ref 25–110)
URINE ASCORBIC ACID, UA: NEGATIVE
URINE BILE: NEGATIVE K/UL — ABNORMAL LOW (ref 1.8–7.0)
URINE BLOOD: NEGATIVE K/UL (ref 3–12)

## 2021-12-02 ENCOUNTER — Ambulatory Visit: Admit: 2021-12-02 | Discharge: 2021-12-03 | Payer: BC Managed Care – PPO

## 2021-12-02 ENCOUNTER — Encounter: Admit: 2021-12-02 | Discharge: 2021-12-02 | Payer: BC Managed Care – PPO

## 2021-12-02 DIAGNOSIS — Z124 Encounter for screening for malignant neoplasm of cervix: Secondary | ICD-10-CM

## 2021-12-02 DIAGNOSIS — Z8669 Personal history of other diseases of the nervous system and sense organs: Secondary | ICD-10-CM

## 2021-12-02 DIAGNOSIS — E039 Hypothyroidism, unspecified: Secondary | ICD-10-CM

## 2021-12-02 DIAGNOSIS — F419 Anxiety disorder, unspecified: Secondary | ICD-10-CM

## 2021-12-02 DIAGNOSIS — Z8619 Personal history of other infectious and parasitic diseases: Secondary | ICD-10-CM

## 2021-12-06 ENCOUNTER — Encounter: Admit: 2021-12-06 | Discharge: 2021-12-06 | Payer: BC Managed Care – PPO

## 2021-12-06 DIAGNOSIS — F419 Anxiety disorder, unspecified: Secondary | ICD-10-CM

## 2021-12-06 DIAGNOSIS — Z8669 Personal history of other diseases of the nervous system and sense organs: Secondary | ICD-10-CM

## 2021-12-06 DIAGNOSIS — Z8619 Personal history of other infectious and parasitic diseases: Secondary | ICD-10-CM

## 2021-12-06 DIAGNOSIS — E039 Hypothyroidism, unspecified: Secondary | ICD-10-CM

## 2021-12-12 ENCOUNTER — Encounter: Admit: 2021-12-12 | Discharge: 2021-12-12 | Payer: BC Managed Care – PPO

## 2021-12-14 ENCOUNTER — Encounter: Admit: 2021-12-14 | Discharge: 2021-12-14 | Payer: BC Managed Care – PPO

## 2021-12-14 MED FILL — RIZATRIPTAN 10 MG PO TAB: 10 mg | 10 days supply | Qty: 10 | Fill #2 | Status: AC

## 2021-12-16 ENCOUNTER — Encounter: Admit: 2021-12-16 | Discharge: 2021-12-16 | Payer: BC Managed Care – PPO

## 2021-12-16 DIAGNOSIS — E039 Hypothyroidism, unspecified: Secondary | ICD-10-CM

## 2021-12-16 DIAGNOSIS — Z8669 Personal history of other diseases of the nervous system and sense organs: Secondary | ICD-10-CM

## 2021-12-16 DIAGNOSIS — Z8619 Personal history of other infectious and parasitic diseases: Secondary | ICD-10-CM

## 2021-12-16 DIAGNOSIS — M6289 Other specified disorders of muscle: Secondary | ICD-10-CM

## 2021-12-16 DIAGNOSIS — F419 Anxiety disorder, unspecified: Secondary | ICD-10-CM

## 2021-12-16 NOTE — Patient Instructions
D-mannose 1 gram twice daily    [facility] Pelvic floor physical therapy    [address], [address]   [place], [place] [address]  [phone]

## 2021-12-16 NOTE — Assessment & Plan Note
She recently experienced a 2-week episode of pelvic fullness and increased urinary urgency/frequency with negative urinalyses and urine culture.  Her symptoms have since spontaneously resolved, though she does report to similar episodes occurring in the past.  As she is currently asymptomatic, there is little utility in initiating medical or surgical therapy, though she is interested in pelvic floor physical therapy.  If her symptoms recur in the future, we will obtain repeat urinalyses and urine culture and revisit therapies.    Plan:  - Referral to pelvic floor physical therapy

## 2022-01-17 ENCOUNTER — Encounter: Admit: 2022-01-17 | Discharge: 2022-01-17 | Payer: BC Managed Care – PPO

## 2022-01-27 ENCOUNTER — Encounter: Admit: 2022-01-27 | Discharge: 2022-01-27 | Payer: BC Managed Care – PPO

## 2022-01-27 DIAGNOSIS — F419 Anxiety disorder, unspecified: Secondary | ICD-10-CM

## 2022-01-27 DIAGNOSIS — E039 Hypothyroidism, unspecified: Secondary | ICD-10-CM

## 2022-01-27 MED ORDER — SERTRALINE 50 MG PO TAB
50 mg | ORAL_TABLET | Freq: Every day | ORAL | 0 refills | Status: AC
Start: 2022-01-27 — End: ?
  Filled 2022-01-28: qty 90, 90d supply, fill #1

## 2022-01-27 MED ORDER — LEVOTHYROXINE 75 MCG PO TAB
75 ug | ORAL_TABLET | Freq: Every day | ORAL | 0 refills | 30.00000 days | Status: AC
Start: 2022-01-27 — End: ?
  Filled 2022-01-28: qty 90, 90d supply, fill #1

## 2022-01-28 ENCOUNTER — Encounter: Admit: 2022-01-28 | Discharge: 2022-01-28 | Payer: BC Managed Care – PPO

## 2022-01-28 MED FILL — RIZATRIPTAN 10 MG PO TAB: 10 mg | 10 days supply | Qty: 10 | Fill #3 | Status: AC

## 2022-02-23 ENCOUNTER — Encounter: Admit: 2022-02-23 | Discharge: 2022-02-23 | Payer: BC Managed Care – PPO

## 2022-03-01 ENCOUNTER — Encounter: Admit: 2022-03-01 | Discharge: 2022-03-01 | Payer: BC Managed Care – PPO

## 2022-03-02 ENCOUNTER — Encounter: Admit: 2022-03-02 | Discharge: 2022-03-02 | Payer: BC Managed Care – PPO

## 2022-03-03 ENCOUNTER — Encounter: Admit: 2022-03-03 | Discharge: 2022-03-03 | Payer: BC Managed Care – PPO

## 2022-03-04 MED FILL — RIZATRIPTAN 10 MG PO TAB: 10 mg | 10 days supply | Qty: 10 | Fill #4 | Status: AC

## 2022-03-07 ENCOUNTER — Encounter: Admit: 2022-03-07 | Discharge: 2022-03-07 | Payer: BC Managed Care – PPO

## 2022-03-22 ENCOUNTER — Ambulatory Visit: Admit: 2022-03-22 | Discharge: 2022-03-23 | Payer: BC Managed Care – PPO

## 2022-03-22 ENCOUNTER — Encounter: Admit: 2022-03-22 | Discharge: 2022-03-22 | Payer: BC Managed Care – PPO

## 2022-03-22 DIAGNOSIS — Z8669 Personal history of other diseases of the nervous system and sense organs: Secondary | ICD-10-CM

## 2022-03-22 DIAGNOSIS — F419 Anxiety disorder, unspecified: Secondary | ICD-10-CM

## 2022-03-22 DIAGNOSIS — R42 Dizziness and giddiness: Secondary | ICD-10-CM

## 2022-03-22 DIAGNOSIS — Z8619 Personal history of other infectious and parasitic diseases: Secondary | ICD-10-CM

## 2022-03-22 DIAGNOSIS — E039 Hypothyroidism, unspecified: Secondary | ICD-10-CM

## 2022-03-22 DIAGNOSIS — G43109 Migraine with aura, not intractable, without status migrainosus: Secondary | ICD-10-CM

## 2022-03-22 MED ORDER — QULIPTA 10 MG PO TAB
10 mg | ORAL_TABLET | Freq: Every day | ORAL | 1 refills | Status: AC
Start: 2022-03-22 — End: ?

## 2022-03-22 NOTE — Progress Notes
Date of Service: 03/22/2022    Kara Ponce is a 36 y.o. female.  DOB: 02-01-1986  MRN: [id number]     Subjective:             Patient Reported Other  What topic(s) would you like to cover during your appointment?:  Migriane prevention, daily  Please describe the issue(s) and history with the issue (location, severity, duration, symptoms, etc.).:  Migraines since high school. Frequent migraines a few times a month.  What has been done so far to take care of the issue(s)?:  Magnesium, topamax, ritzatriptan  What are your goals for this visit?:  Migraine coverage    Chief Complaint   Patient presents with   ? Medication Management     Would like to start daily med for HA prevention        Migraines are several times per week.  Very debilitating and interfere with her work and daily life.  Her migraines are associated with vertigo, nausea, blurred vision, photophobia, phonophobia. She also gets an aura.  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  She previously took Topamax in high school for prevention, but this caused drowsiness and weight loss.  She continues magnesium every day for several years.  She has an IUD for birth control.        Medical History:   Diagnosis Date   ? Anxiety    ? History of chicken pox    ? History of migraine headaches    ? Hypothyroidism      Surgical History:   Procedure Laterality Date   ? HX WISDOM TEETH EXTRACTION  05/23/2003   ? CYSTOSCOPY  08/13/2009    NORMAL   ? rectal nodule N/A 12/26/2017    Performed by Tempie Hoist, DO at [facility] ENDO   ? Right cubital tunnel release, possible transposition Right 01/14/2018    Performed by Desiree Hane, MD at [facility] OR   ? ANORECTAL MANOMETRY N/A 02/15/2018    Performed by Eliott Nine, MD at [facility] ENDO   ? HX MICROPHLEBECTOMY Left 04/05/2021    left leg microphlebectomy- Dr. Theodoro Grist     Family History   Problem Relation Age of Onset   ? Hypertension Mother    ? Thyroid Disease Mother    ? Heart Disease Mother    ? Hypertension Father    ? Stroke Father    ? Hearing Loss Father    ? High Cholesterol Father    ? Coronary Artery Disease Father 43        s/p CABG   ? Colon Polyps Father    ? Diabetes Maternal Grandfather    ? Heart Disease Paternal Grandmother    ? Heart Disease Paternal Grandfather    ? Anesthetic Complication Neg Hx    ? Cancer-Breast Neg Hx    ? Cancer-Colon Neg Hx    ? Cancer-Ovarian Neg Hx    ? Cancer-Uterine Neg Hx    ? Bleeding Disorders Neg Hx    ? VTE Neg Hx    ? Cancer Neg Hx      Social History     Socioeconomic History   ? Marital status: Married     Spouse name: Kara Ponce   ? Number of children: 2   Occupational History   ? Occupation: Education administrator: [employer]   Tobacco Use   ? Smoking  status: Never   ? Smokeless tobacco: Never   Substance and Sexual Activity   ? Alcohol use: No     Alcohol/week: 1.0 - 2.0 standard drink of alcohol     Types: 1 - 2 Standard drinks or equivalent per week   ? Drug use: No   ? Sexual activity: Yes     Partners: Male     Birth control/protection: I.U.D.   Other Topics Concern   ? Caffeine Concern Yes     Comment: 1 cup per day   ? Exercise Yes     Comment: 5x per week, run , elliptical   ? Seat Belt Yes   ? Self-Exams Yes   Social History Narrative    She works as a Garment/textile technologist for [employer].  She has two young children.               Review of Systems   Constitutional: Negative for chills, fever, malaise/fatigue, weight gain and weight loss.   HENT: Negative for congestion and sore throat.    Eyes: Negative for visual disturbance.   Cardiovascular: Negative for chest pain, dyspnea on exertion, leg swelling and palpitations.   Respiratory: Negative for cough and shortness of breath.    Skin: Negative for rash.   Musculoskeletal: Negative for joint pain and myalgias.   Gastrointestinal: Negative for abdominal pain, change in bowel habit, constipation, diarrhea, hematochezia, melena, nausea and vomiting.   Genitourinary: Negative for dysuria, frequency and hematuria.   Neurological: Positive for headaches and vertigo. Negative for dizziness and light-headedness.   Psychiatric/Behavioral: Negative for depression. The patient is not nervous/anxious.            Objective:         ? atogepant (QULIPTA) 10 mg tablet Take one tablet by mouth daily. Indications: migraine prevention   ? hydroquinone (ELDOQUIN FORTE) 4 % cream Apply  topically to affected area twice daily.   ? levothyroxine (SYNTHROID) 75 mcg tablet Take one tablet by mouth daily.   ? other medication Apply to affected areas Topical 2-3 times weekly increase to daily as tolerated for 30 days   ? rizatriptan (MAXALT) 10 mg tablet Take one tablet by mouth at onset of headache. May repeat after 2 hours. Max of 30 mg in 24 hours.   ? sertraline (ZOLOFT) 50 mg tablet Take one tablet by mouth daily.     There were no vitals filed for this visit.  There is no height or weight on file to calculate BMI.     Physical Exam  Vitals reviewed.   Constitutional:       General: She is not in acute distress.     Appearance: Normal appearance.   HENT:      Head: Atraumatic.      Right Ear: External ear normal.      Left Ear: External ear normal.   Eyes:      Extraocular Movements: Extraocular movements intact.   Pulmonary:      Effort: Pulmonary effort is normal. No respiratory distress.   Neurological:      General: No focal deficit present.      Mental Status: She is alert and oriented to person, place, and time. Mental status is at baseline.   Psychiatric:         Mood and Affect: Mood normal.         Behavior: Behavior normal.         Thought Content: Thought content normal.  Judgment: Judgment normal.              Assessment and Plan:  Kara Ponce was seen today for medication management.    Diagnoses and all orders for this visit:    Migraine with aura and without status migrainosus, not intractable  - She has tried and failed several other preventative medications: Topamax and Magnesium.   - Start Qulipta 10mg  daily for migraine prevention. Increase dosage as needed.  - Continue Maxalt prn.  - Provided her with sample of Ubrelvy for breakthrough prn.  - Continue Magnesium daily.  -     atogepant (QULIPTA) 10 mg tablet; Take one tablet by mouth daily. Indications: migraine prevention    Vertigo  - Secondary to above.           25 minutes spent on this patient's encounter with counseling and coordination of care taking >50% of the visit.

## 2022-03-23 ENCOUNTER — Encounter: Admit: 2022-03-23 | Discharge: 2022-03-23 | Payer: BC Managed Care – PPO

## 2022-03-23 DIAGNOSIS — E039 Hypothyroidism, unspecified: Secondary | ICD-10-CM

## 2022-03-23 MED ORDER — LEVOTHYROXINE 75 MCG PO TAB
75 ug | ORAL_TABLET | Freq: Every day | ORAL | 0 refills
Start: 2022-03-23 — End: ?

## 2022-03-23 NOTE — Progress Notes
The prior authorization for Kara Ponce has been submitted for Kara Ponce via Cover My Meds. Will continue to follow.    [place] Patient Advocate  [phone]

## 2022-03-24 ENCOUNTER — Encounter: Admit: 2022-03-24 | Discharge: 2022-03-24 | Payer: BC Managed Care – PPO

## 2022-03-26 ENCOUNTER — Encounter: Admit: 2022-03-26 | Discharge: 2022-03-26 | Payer: BC Managed Care – PPO

## 2022-03-26 MED FILL — RIZATRIPTAN 10 MG PO TAB: 10 mg | 10 days supply | Qty: 10 | Fill #5 | Status: AC

## 2022-03-27 ENCOUNTER — Encounter: Admit: 2022-03-27 | Discharge: 2022-03-27 | Payer: BC Managed Care – PPO

## 2022-03-27 DIAGNOSIS — G43109 Migraine with aura, not intractable, without status migrainosus: Secondary | ICD-10-CM

## 2022-03-27 MED ORDER — UBRELVY 100 MG PO TAB
100 mg | ORAL_TABLET | Freq: Every day | ORAL | 3 refills | 30.00000 days | Status: AC | PRN
Start: 2022-03-27 — End: ?

## 2022-03-27 MED ORDER — UBRELVY 100 MG PO TAB
100 mg | ORAL_TABLET | Freq: Every day | ORAL | 3 refills | 30.00000 days | Status: DC | PRN
Start: 2022-03-27 — End: 2022-03-27
  Filled 2022-03-30: qty 10, 30d supply, fill #1

## 2022-03-27 NOTE — Progress Notes
The Prior Authorization for qulipta was denied for Kara Ponce.     Insurance provided the following alternative medication(s): aimovig and ajovy     [employer] provided the following reason(s): Marland Kitchen     If an alternative therapy is not appropriate and the prescribed medication and dose are necessary, the insurance will require the decision be appealed. This will require a letter of medical necessary. The provider's nurse was notified. To proceed with an appeal, completed appeal materials should be returned to the specialty pharmacy team at [phone]. The Specialty Pharmacy team will fax received appeal materials to insurance provider at [employer]  and follow submission status.     Patient will be contacted once provider's nurse advises on next steps.     [place] Patient Advocate  [phone]

## 2022-03-27 NOTE — Progress Notes
The Prior Authorization for Bennie Pierini was denied for De Hollingshead.     Insurance provided the following alternatives: Ajovy, Aimovig, or Emgality    [employer] provided the following reason(s):    Decision Notes: This drug is not on our list of covered drugs, also known as a formulary. Our Coverage Determinations - Exceptions policy is used to decide if a not-covered drug can be approved. The conditions in this policy have not been met. From the records that we have received, these reasons caused the denial: 1) All covered drugs used for your health issue have not been tried and failed. Other drugs that can be used are Ajovy, Aimovig, Emgality. Please look at the formulary to see what drugs are covered. Prior authorization may be required and quantity limits may apply to covered drugs. ADDITIONAL INFORMATION FOR YOUR HEALTH CARE PROVIDER: This request has not been approved because this drug is not on formulary. An exception to allow coverage of a non-formulary drug may be granted if all conditions in our Coverage Determinations - Exceptions policy are met. From the information we have received, the member does not meet number 2 of the exception policy criteria. The reason for denial is explained to the member above. The criteria from the policy are listed here. 1) The drug is being used for a condition approved by the [place] (FDA). 2) All formulary alternatives have been tried or medical reasons have been provided why all other covered drugs cannot be tried. 3) Records have been received showing the requested drug is medically necessary. These should include relevant medical history and lab results, past treatments tried with dates of trial and responses, and any other evidence to show the covered drugs are likely to be ineffective or unsafe for the member. 4) Prescription drug samples were not used to establish treatment. Since criteria have not been met, we are unable to approve coverage for this drug at this time. Please refer to the formulary for information on what is covered. Prior authorization may be required and quantity limits may apply to covered drugs.    If an alternative therapy is not appropriate and the prescribed medication and dose are necessary, the insurance will require the decision be appealed. This will require a letter of medical necessary. The provider was notified. To proceed with an appeal, completed appeal materials should be returned to the specialty pharmacy team at [phone]. The Specialty Pharmacy team will fax received appeal materials to insurance provider at [phone] and follow submission status.     Patient will be contacted once provider advises on next steps.     Kara Ponce  Pharmacy Patient Advocate  [phone]

## 2022-03-28 ENCOUNTER — Encounter: Admit: 2022-03-28 | Discharge: 2022-03-28 | Payer: BC Managed Care – PPO

## 2022-03-28 NOTE — Progress Notes
The prior authorization for Bernita Raisin has been submitted for De Hollingshead via Cover My Meds.  Will continue to follow.    Mont Dutton  Pharmacy Patient Advocate  [phone]

## 2022-03-29 ENCOUNTER — Encounter: Admit: 2022-03-29 | Discharge: 2022-03-29 | Payer: BC Managed Care – PPO

## 2022-03-30 ENCOUNTER — Encounter: Admit: 2022-03-30 | Discharge: 2022-03-30 | Payer: BC Managed Care – PPO

## 2022-03-30 NOTE — Progress Notes
The Prior Authorization for Bernita Raisin was approved for Daneille Desilva from 03/28/2022 to 05/20/2098.  The copay is $.0       Lana Flaim has stated this copay is affordable.  The specialty pharmacy will pursue additional copay assistance as necessary.  The specialty pharmacy will reach out to the provider's nurse if the copay becomes unaffordable.    The medication will be delivered to patient's prescription address per the patient's request.    Monica Becton  Pharmacy Patient Advocate  [phone]

## 2022-04-07 ENCOUNTER — Encounter: Admit: 2022-04-07 | Discharge: 2022-04-07 | Payer: BC Managed Care – PPO

## 2022-04-07 NOTE — Progress Notes
Pharmacy Benefits Investigation    Medication name: AIMOVIG AUTOINJECTOR SC    The insurance requires a prior authorization for the medication. The prior authorization was submitted via CoverMyMeds. Will follow up in 2 business days.    PA number: [id number]    Wallene Dales  Specialty Pharmacy Patient Advocate

## 2022-04-10 ENCOUNTER — Encounter: Admit: 2022-04-10 | Discharge: 2022-04-10 | Payer: BC Managed Care – PPO

## 2022-04-10 NOTE — Progress Notes
Pharmacy Benefits Investigation    Medication name: AIMOVIG AUTOINJECTOR 70 MG/ML SC ATIN    The prior authorization was approved for Kara Ponce from 04/07/2022 through 05/20/2098. The prior authorization number is no PA number .    The out of pocket cost is $66.61.    After assistance, the final out of pocket cost is $5.    De Hollingshead stated the copay is affordable. Per patient's request, the medication will be shipped to the patient's address.    Monica Becton  Specialty Pharmacy Patient Advocate

## 2022-04-11 ENCOUNTER — Encounter: Admit: 2022-04-11 | Discharge: 2022-04-11 | Payer: BC Managed Care – PPO

## 2022-04-11 MED FILL — AIMOVIG AUTOINJECTOR 70 MG/ML SC ATIN: 70 mg/mL | SUBCUTANEOUS | 30 days supply | Qty: 1 | Fill #1 | Status: AC

## 2022-05-03 ENCOUNTER — Encounter: Admit: 2022-05-03 | Discharge: 2022-05-03 | Payer: BC Managed Care – PPO

## 2022-05-03 DIAGNOSIS — F419 Anxiety disorder, unspecified: Secondary | ICD-10-CM

## 2022-05-03 MED ORDER — SERTRALINE 50 MG PO TAB
50 mg | ORAL_TABLET | Freq: Every day | ORAL | 0 refills | Status: AC
Start: 2022-05-03 — End: ?
  Filled 2022-05-03: qty 90, 90d supply, fill #1

## 2022-05-03 MED FILL — LEVOTHYROXINE 75 MCG PO TAB: 75 mcg | ORAL | 90 days supply | Qty: 90 | Fill #1 | Status: AC

## 2022-05-03 MED FILL — UBRELVY 100 MG PO TAB: 100 mg | ORAL | 30 days supply | Qty: 10 | Fill #2 | Status: AC

## 2022-05-04 MED FILL — AIMOVIG AUTOINJECTOR 70 MG/ML SC ATIN: 70 mg/mL | SUBCUTANEOUS | 30 days supply | Qty: 1 | Fill #2 | Status: AC

## 2022-05-11 ENCOUNTER — Encounter: Admit: 2022-05-11 | Discharge: 2022-05-11 | Payer: BC Managed Care – PPO

## 2022-05-11 DIAGNOSIS — N390 Urinary tract infection, site not specified: Secondary | ICD-10-CM

## 2022-05-11 DIAGNOSIS — 1 ERRONEOUS ENCOUNTER--DISREGARD: Secondary | ICD-10-CM

## 2022-05-11 DIAGNOSIS — B379 Candidiasis, unspecified: Secondary | ICD-10-CM

## 2022-05-11 MED ORDER — FLUCONAZOLE 150 MG PO TAB
ORAL_TABLET | ORAL | 0 refills | 3.00000 days | Status: AC
Start: 2022-05-11 — End: ?
  Filled 2022-05-11: qty 2, 7d supply, fill #1

## 2022-05-11 MED ORDER — NITROFURANTOIN MONOHYD/M-CRYST 100 MG PO CAP
100 mg | ORAL_CAPSULE | Freq: Two times a day (BID) | ORAL | 0 refills | 7.00000 days | Status: AC
Start: 2022-05-11 — End: ?
  Filled 2022-05-11: qty 14, 7d supply, fill #1

## 2022-05-11 NOTE — Progress Notes
Telehealth Visit Note    Date of Service: 05/11/2022    Subjective:      Chief Complaint   Patient presents with    Urinary Pain          Kara Ponce is a 36 y.o. female.    History of Present Illness    Patient reports urinary frequency, urgency, abdominal pain, back pain X 2 days  No urinary pain  Completed home UTI test- positive   Last UTI 7/23  IUD  Works in [place]- not able to void frequently      Review of Systems   Constitutional:  Negative for fever.   Respiratory:  Negative for cough.    Cardiovascular:  Negative for chest pain.   Gastrointestinal:  Positive for abdominal pain.   Genitourinary:  Positive for flank pain, frequency and urgency. Negative for dysuria.       .  Objective:          erenumab-aooe (AIMOVIG AUTOINJECTOR) 70 mg/mL injection syringe Inject 1 mL under the skin every 30 days. Indications: migraine prevention    hydroquinone (ELDOQUIN FORTE) 4 % cream Apply  topically to affected area twice daily.    levothyroxine (SYNTHROID) 75 mcg tablet Take one tablet by mouth daily.    other medication Apply to affected areas Topical 2-3 times weekly increase to daily as tolerated for 30 days    rizatriptan (MAXALT) 10 mg tablet Take one tablet by mouth at onset of headache. May repeat after 2 hours. Max of 30 mg in 24 hours.    sertraline (ZOLOFT) 50 mg tablet Take one tablet by mouth daily.    ubrogepant (UBRELVY) 100 mg tablet Take one tablet by mouth daily as needed. May repeat once after 2 hours based on response.  Indications: a migraine headache           Telehealth Body Mass Index: 26.39 at 05/11/2022  8:16 AM    Physical Exam  Constitutional:       Appearance: Normal appearance.      Comments: Virtual Visit completed      Eyes:      Extraocular Movements: Extraocular movements intact.   Cardiovascular:      Comments: No distress noted  Pulmonary:      Comments: No cough noted  Musculoskeletal:      Cervical back: Normal range of motion and neck supple.   Neurological:      Mental Status: She is alert and oriented to person, place, and time.              Assessment and Plan:  1. Urinary tract infection with hematuria, site unspecified        2. Antibiotic-induced yeast infection           Orders Placed This Encounter    nitrofurantoin monohyd/m-cryst (MACROBID) 100 mg capsule    fluconazole (DIFLUCAN) 150 mg tablet                  Patient Instructions   Hello Jeanet    It was nice to complete a Virtual Visit with you today.     Hope that your symptoms resolve quickly.   Thank you,   Hosie Spangle APRN     1)Take antibiotic as prescribed for 7 days. Take with food.    If your symptoms do not improve in 2 days please go to Urgent care, Primary Care Doctor, or OBGYN for further evaluation ( urine culture)  Drink plenty of water and cranberry juice.     If it applies, avoid sexual intercourse until symptoms have resolved and you have finished the antibiotic.     Always urinate immediately after sexual intercourse.      Wipe front to back after going to the bathroom to help prevent future infections.     Do not hold your urine for long periods of time. This can cause bacteria to build up, leading to an infection. Urinate frequently and try to empty your bladder completely.     Go to the emergency room, if you develop temp >101F, vomiting, moderate or severe back or abdominal pain, inability to urinate, dizziness/confusion, or start to feel extremely ill          Vaginal yeast infection      Take one Diflucan tablet by mouth as soon as possible. If your symptoms persist more than 7 days after taking the first pill, you can take the second pill.    If you take both pills and symptoms are still present, you should f/up with gynecologist.     You can use over the counter miconazole vaginal cream as needed for the itching- apply to outer areas of vagina.       Hope that your symptoms improve quickly  Thank you, Hosie Spangle APRN            7  minutes spent on this patient's encounter with counseling and coordination of care taking >50% of the visit.

## 2022-05-11 NOTE — Progress Notes
Chief Complaint   Patient presents with    Urinary Pain     Patient connected and disconnected

## 2022-05-25 ENCOUNTER — Encounter: Admit: 2022-05-25 | Discharge: 2022-05-25 | Payer: BC Managed Care – PPO

## 2022-05-27 ENCOUNTER — Encounter: Admit: 2022-05-27 | Discharge: 2022-05-27 | Payer: BC Managed Care – PPO

## 2022-05-28 MED FILL — RIZATRIPTAN 10 MG PO TAB: 10 mg | 10 days supply | Qty: 10 | Fill #6 | Status: AC

## 2022-06-14 ENCOUNTER — Encounter: Admit: 2022-06-14 | Discharge: 2022-06-14 | Payer: BC Managed Care – PPO

## 2022-06-14 MED FILL — UBRELVY 100 MG PO TAB: 100 mg | ORAL | 30 days supply | Qty: 10 | Fill #3 | Status: AC

## 2022-06-14 MED FILL — AIMOVIG AUTOINJECTOR 70 MG/ML SC ATIN: 70 mg/mL | SUBCUTANEOUS | 30 days supply | Qty: 1 | Fill #3 | Status: AC

## 2022-06-28 ENCOUNTER — Encounter: Admit: 2022-06-28 | Discharge: 2022-06-28 | Payer: BC Managed Care – PPO

## 2022-06-28 ENCOUNTER — Ambulatory Visit: Admit: 2022-06-28 | Discharge: 2022-06-28 | Payer: BC Managed Care – PPO

## 2022-06-28 DIAGNOSIS — M25561 Pain in right knee: Secondary | ICD-10-CM

## 2022-06-28 DIAGNOSIS — S83241D Other tear of medial meniscus, current injury, right knee, subsequent encounter: Secondary | ICD-10-CM

## 2022-06-28 DIAGNOSIS — F419 Anxiety disorder, unspecified: Secondary | ICD-10-CM

## 2022-06-28 DIAGNOSIS — Z8669 Personal history of other diseases of the nervous system and sense organs: Secondary | ICD-10-CM

## 2022-06-28 DIAGNOSIS — Z8619 Personal history of other infectious and parasitic diseases: Secondary | ICD-10-CM

## 2022-06-28 DIAGNOSIS — E039 Hypothyroidism, unspecified: Secondary | ICD-10-CM

## 2022-06-28 NOTE — Patient Instructions
If you need to schedule or reschedule your MRI, CT, etc, please call radiology scheduling at [phone]. If you choose to go outside of the [facility] to get your MRI done, please contact facility of your choosing to set up exam.  Once exam complete, please notify this office so we can obtain your imaging and report.  If you go outside of the health system, Dr Mullen will put a comment in once he has a chance to review your report with recommendations.  Your copy of the full report will come from the facility that did your imaging. Please know that your insurance company MAY take up to 14 business days to approve your exam.       Please do not hesitate to contact my office with any questions.     Scott Mullen, MD FAAOS - Orthopedic Surgeon, Sports Medicine  Stephen Payne, APRN  [facility] - Phone [phone] - Fax [phone]   [address], [address] - [place], [place] [address]    Dr Mullen's clinic phone line: [phone] - this phone is not physically able to be answered every day, but voicemail is checked regularly and responded to as quickly as possible.    Preferred contact for non-urgent clinical questions: MyChart    For follow up appointments, please call [phone].    Bethany Rogers, MS, LAT, ATC - Clinical Athletic Trainer  Horald Birky, RN, BSN - Clinical Nurse Coordinator    Thank you for supporting our practice! Your feedback helps us deliver the highest quality care.  Review us at: [email or web address]

## 2022-06-29 ENCOUNTER — Encounter: Admit: 2022-06-29 | Discharge: 2022-06-29 | Payer: BC Managed Care – PPO

## 2022-06-29 DIAGNOSIS — G43009 Migraine without aura, not intractable, without status migrainosus: Secondary | ICD-10-CM

## 2022-06-29 MED ORDER — RIZATRIPTAN 10 MG PO TAB
ORAL_TABLET | 5 refills | Status: AC
Start: 2022-06-29 — End: ?
  Filled 2022-07-02: qty 10, 30d supply, fill #1

## 2022-06-30 ENCOUNTER — Encounter: Admit: 2022-06-30 | Discharge: 2022-06-30 | Payer: BC Managed Care – PPO

## 2022-07-01 ENCOUNTER — Encounter: Admit: 2022-07-01 | Discharge: 2022-07-01 | Payer: BC Managed Care – PPO

## 2022-07-02 ENCOUNTER — Encounter: Admit: 2022-07-02 | Discharge: 2022-07-02 | Payer: BC Managed Care – PPO

## 2022-07-13 ENCOUNTER — Encounter: Admit: 2022-07-13 | Discharge: 2022-07-13 | Payer: BC Managed Care – PPO

## 2022-07-16 ENCOUNTER — Encounter: Admit: 2022-07-16 | Discharge: 2022-07-16 | Payer: BC Managed Care – PPO

## 2022-07-16 ENCOUNTER — Ambulatory Visit: Admit: 2022-07-16 | Discharge: 2022-07-16 | Payer: BC Managed Care – PPO

## 2022-07-16 DIAGNOSIS — M25561 Pain in right knee: Secondary | ICD-10-CM

## 2022-07-16 DIAGNOSIS — S83241D Other tear of medial meniscus, current injury, right knee, subsequent encounter: Secondary | ICD-10-CM

## 2022-07-17 ENCOUNTER — Encounter: Admit: 2022-07-17 | Discharge: 2022-07-17 | Payer: BC Managed Care – PPO

## 2022-07-17 ENCOUNTER — Ambulatory Visit: Admit: 2022-07-17 | Discharge: 2022-07-18 | Payer: BC Managed Care – PPO

## 2022-07-17 DIAGNOSIS — Z8669 Personal history of other diseases of the nervous system and sense organs: Secondary | ICD-10-CM

## 2022-07-17 DIAGNOSIS — F419 Anxiety disorder, unspecified: Secondary | ICD-10-CM

## 2022-07-17 DIAGNOSIS — M25561 Pain in right knee: Secondary | ICD-10-CM

## 2022-07-17 DIAGNOSIS — E039 Hypothyroidism, unspecified: Secondary | ICD-10-CM

## 2022-07-17 DIAGNOSIS — Z8619 Personal history of other infectious and parasitic diseases: Secondary | ICD-10-CM

## 2022-07-17 NOTE — Patient Instructions
You received a cortisone injection today which included the following medications: kenalog & ropivacaine    Please follow the appropriate instructions below:  Recurring pain - Injections are done using a local anesthetic.  The numbing effect typically lasts for approximately one hour and then wears off.  Expect pain to return after the first hour and then diminish within 1-2 days.  Rest the area - Be careful with the injected area/joint.  Do not overuse this area.  Do not use this area for more than mild activities.  It is also important to not splint or keep this area completely still, unless instructed to do so.  Watch for ?Steroid Flare? - Sometimes after a steroid injection, the area or joint may feel more painful for the first day or two.  This is due to the body?s natural defense against the steroid.  To help ease any discomfort, place ice over the area for 20 minutes every 4 hours for the first two days.  Watch for signs of infection - Although precautions were taken to prevent infection, be alert for signs of infection.  This includes: fever > 100 degrees farenheit, increased warmth or redness in the injected area, redness spreading from the injected site or swelling.  If any of these develop, please call Liane Comber, APRN-NP's office at [phone].     Please do not hesitate to contact my office with any questions.     Yong Channel, MD FAAOS - Orthopedic Surgeon, Sports Medicine  Dewayne Hatch, APRN  [facility] - Phone [phone] - Fax [phone]   [address], [address] - [place], [place] [address]    Dr [facility] clinic phone line: [phone] - this phone is not physically able to be answered every day, but voicemail is checked regularly and responded to as quickly as possible.    Preferred contact for non-urgent clinical questions: MyChart    For follow up appointments, please call [phone].    Roel Cluck, MS, LAT, ATC - Clinical Athletic Trainer  Denese Killings, RN, BSN - Clinical Nurse Coordinator    Thank you for supporting our practice! Your feedback helps [place] deliver the highest quality care.  Review [place] at: [email or web address]

## 2022-07-22 ENCOUNTER — Encounter: Admit: 2022-07-22 | Discharge: 2022-07-22 | Payer: BC Managed Care – PPO

## 2022-07-24 ENCOUNTER — Encounter: Admit: 2022-07-24 | Discharge: 2022-07-24 | Payer: BC Managed Care – PPO

## 2022-07-24 MED ORDER — NITROFURANTOIN MONOHYD/M-CRYST 100 MG PO CAP
100 mg | ORAL_CAPSULE | Freq: Two times a day (BID) | ORAL | 0 refills | 7.00000 days | Status: AC
Start: 2022-07-24 — End: ?
  Filled 2022-07-25: qty 14, 7d supply, fill #1

## 2022-07-24 MED ORDER — FLUCONAZOLE 150 MG PO TAB
ORAL_TABLET | ORAL | 0 refills | 3.00000 days | Status: AC
Start: 2022-07-24 — End: ?
  Filled 2022-07-25: qty 2, 3d supply, fill #1

## 2022-07-25 ENCOUNTER — Encounter: Admit: 2022-07-25 | Discharge: 2022-07-25 | Payer: BC Managed Care – PPO

## 2022-07-25 MED FILL — UBRELVY 100 MG PO TAB: 100 mg | ORAL | 30 days supply | Qty: 10 | Fill #4 | Status: AC

## 2022-07-25 MED FILL — LEVOTHYROXINE 75 MCG PO TAB: 75 mcg | ORAL | 90 days supply | Qty: 90 | Fill #2 | Status: AC

## 2022-07-25 MED FILL — AIMOVIG AUTOINJECTOR 70 MG/ML SC ATIN: 70 mg/mL | SUBCUTANEOUS | 30 days supply | Qty: 1 | Fill #4 | Status: AC

## 2022-08-11 ENCOUNTER — Encounter: Admit: 2022-08-11 | Discharge: 2022-08-11 | Payer: BC Managed Care – PPO

## 2022-08-11 ENCOUNTER — Ambulatory Visit: Admit: 2022-08-11 | Discharge: 2022-08-12 | Payer: BC Managed Care – PPO

## 2022-08-12 DIAGNOSIS — M25561 Pain in right knee: Secondary | ICD-10-CM

## 2022-08-18 ENCOUNTER — Ambulatory Visit: Admit: 2022-08-18 | Discharge: 2022-08-19 | Payer: BC Managed Care – PPO

## 2022-08-18 ENCOUNTER — Encounter: Admit: 2022-08-18 | Discharge: 2022-08-18 | Payer: BC Managed Care – PPO

## 2022-08-25 ENCOUNTER — Encounter: Admit: 2022-08-25 | Discharge: 2022-08-25 | Payer: BC Managed Care – PPO

## 2022-08-25 DIAGNOSIS — G43109 Migraine with aura, not intractable, without status migrainosus: Secondary | ICD-10-CM

## 2022-08-25 MED ORDER — UBRELVY 100 MG PO TAB
100 mg | ORAL_TABLET | Freq: Every day | ORAL | 3 refills | PRN
Start: 2022-08-25 — End: ?

## 2022-08-28 ENCOUNTER — Encounter: Admit: 2022-08-28 | Discharge: 2022-08-28 | Payer: BC Managed Care – PPO

## 2022-08-30 ENCOUNTER — Encounter: Admit: 2022-08-30 | Discharge: 2022-08-30 | Payer: BC Managed Care – PPO

## 2022-08-30 MED FILL — AIMOVIG AUTOINJECTOR 70 MG/ML SC ATIN: 70 mg/mL | SUBCUTANEOUS | 30 days supply | Qty: 1 | Fill #5 | Status: AC

## 2022-08-30 MED FILL — UBRELVY 100 MG PO TAB: 100 mg | ORAL | 30 days supply | Qty: 10 | Fill #1 | Status: AC

## 2022-09-01 ENCOUNTER — Ambulatory Visit: Admit: 2022-09-01 | Discharge: 2022-09-02 | Payer: BC Managed Care – PPO

## 2022-09-01 ENCOUNTER — Encounter: Admit: 2022-09-01 | Discharge: 2022-09-01 | Payer: BC Managed Care – PPO

## 2022-09-22 ENCOUNTER — Encounter: Admit: 2022-09-22 | Discharge: 2022-09-22 | Payer: BC Managed Care – PPO

## 2022-09-22 ENCOUNTER — Ambulatory Visit: Admit: 2022-09-22 | Discharge: 2022-09-23 | Payer: BC Managed Care – PPO

## 2022-09-23 ENCOUNTER — Encounter: Admit: 2022-09-23 | Discharge: 2022-09-23 | Payer: BC Managed Care – PPO

## 2022-09-25 ENCOUNTER — Encounter: Admit: 2022-09-25 | Discharge: 2022-09-25 | Payer: BC Managed Care – PPO

## 2022-09-25 MED FILL — UBRELVY 100 MG PO TAB: 100 mg | ORAL | 30 days supply | Qty: 10 | Fill #2 | Status: AC

## 2022-09-26 ENCOUNTER — Encounter: Admit: 2022-09-26 | Discharge: 2022-09-26 | Payer: BC Managed Care – PPO

## 2022-09-26 MED FILL — AIMOVIG AUTOINJECTOR 70 MG/ML SC ATIN: 70 mg/mL | SUBCUTANEOUS | 30 days supply | Qty: 1 | Fill #6 | Status: AC

## 2022-10-06 ENCOUNTER — Encounter: Admit: 2022-10-06 | Discharge: 2022-10-06 | Payer: BC Managed Care – PPO

## 2022-10-06 ENCOUNTER — Ambulatory Visit: Admit: 2022-10-06 | Discharge: 2022-10-07 | Payer: BC Managed Care – PPO

## 2022-10-18 ENCOUNTER — Encounter: Admit: 2022-10-18 | Discharge: 2022-10-18 | Payer: BC Managed Care – PPO

## 2022-10-18 DIAGNOSIS — G43109 Migraine with aura, not intractable, without status migrainosus: Secondary | ICD-10-CM

## 2022-10-18 MED ORDER — AIMOVIG AUTOINJECTOR 70 MG/ML SC ATIN
70 mg | SUBCUTANEOUS | 2 refills | 28.00000 days | Status: AC
Start: 2022-10-18 — End: ?

## 2022-10-23 ENCOUNTER — Encounter: Admit: 2022-10-23 | Discharge: 2022-10-23 | Payer: BC Managed Care – PPO

## 2022-10-24 ENCOUNTER — Encounter: Admit: 2022-10-24 | Discharge: 2022-10-24 | Payer: BC Managed Care – PPO

## 2022-10-27 ENCOUNTER — Encounter: Admit: 2022-10-27 | Discharge: 2022-10-27 | Payer: BC Managed Care – PPO

## 2022-10-27 DIAGNOSIS — G43109 Migraine with aura, not intractable, without status migrainosus: Secondary | ICD-10-CM

## 2022-10-27 MED ORDER — AIMOVIG AUTOINJECTOR 70 MG/ML SC ATIN
70 mg | SUBCUTANEOUS | 0 refills | 28.00000 days | Status: AC
Start: 2022-10-27 — End: ?
  Filled 2022-10-31: qty 3, 90d supply, fill #1

## 2022-10-27 NOTE — Telephone Encounter
Received a VM from [facility] asking for a VO for a 90-day supply for pt's Aimovig injectors per [facility] requirement.      Per chart review, can see that pt's last prescription was sent for a 30-day supply plush two refills on 10/18/22 to [facility].    New prescription sent for a 90-day supply to [facility].  Did attempt to call the pharmacy back, but hung up after being on hold for 8 minutes.

## 2022-10-29 ENCOUNTER — Encounter: Admit: 2022-10-29 | Discharge: 2022-10-29 | Payer: BC Managed Care – PPO

## 2022-10-30 ENCOUNTER — Encounter: Admit: 2022-10-30 | Discharge: 2022-10-30 | Payer: BC Managed Care – PPO

## 2022-10-31 ENCOUNTER — Encounter: Admit: 2022-10-31 | Discharge: 2022-10-31 | Payer: BC Managed Care – PPO

## 2022-11-10 ENCOUNTER — Encounter: Admit: 2022-11-10 | Discharge: 2022-11-10 | Payer: BC Managed Care – PPO

## 2022-11-11 ENCOUNTER — Encounter: Admit: 2022-11-11 | Discharge: 2022-11-11 | Payer: BC Managed Care – PPO

## 2022-11-12 ENCOUNTER — Encounter: Admit: 2022-11-12 | Discharge: 2022-11-12 | Payer: BC Managed Care – PPO

## 2022-11-13 ENCOUNTER — Encounter: Admit: 2022-11-13 | Discharge: 2022-11-13 | Payer: BC Managed Care – PPO

## 2022-11-13 MED FILL — RIZATRIPTAN 10 MG PO TAB: 10 mg | 30 days supply | Qty: 10 | Fill #2 | Status: AC

## 2022-11-14 ENCOUNTER — Encounter: Admit: 2022-11-14 | Discharge: 2022-11-14 | Payer: BC Managed Care – PPO

## 2022-11-15 ENCOUNTER — Encounter: Admit: 2022-11-15 | Discharge: 2022-11-15 | Payer: BC Managed Care – PPO

## 2022-11-15 ENCOUNTER — Ambulatory Visit: Admit: 2022-11-15 | Discharge: 2022-11-15 | Payer: BC Managed Care – PPO

## 2022-11-15 DIAGNOSIS — B379 Candidiasis, unspecified: Secondary | ICD-10-CM

## 2022-11-15 DIAGNOSIS — R300 Dysuria: Secondary | ICD-10-CM

## 2022-11-15 MED ORDER — FLUCONAZOLE 150 MG PO TAB
ORAL_TABLET | ORAL | 0 refills | 3.00000 days | Status: AC
Start: 2022-11-15 — End: ?
  Filled 2022-11-15: qty 2, 7d supply, fill #1

## 2022-11-15 MED ORDER — CEPHALEXIN 500 MG PO CAP
500 mg | ORAL_CAPSULE | Freq: Two times a day (BID) | ORAL | 0 refills | Status: AC
Start: 2022-11-15 — End: ?
  Filled 2022-11-15: qty 14, 7d supply, fill #1

## 2022-11-15 NOTE — Progress Notes
Telehealth Visit Note    Date of Service: 11/15/2022    Subjective:      Chief Complaint   Patient presents with    Bladder infection          Kara Ponce is a 37 y.o. female.    History of Present Illness      Patient reports urinary frequency, abdominal fullness, back pain  X 3 days  Home UTI test- positive nitrates, leukocytes   Denies blood in urine, fever   MV:[id number]  [place]   Last UTI 6 months  Per chart 11/2021-  urine culture  <100,000 CFU/ml  STREPTOCOCCUS ANGINOSUS GROUP     Culture  Abnormal   <100,000 CFU/ml  LACTOBACILLUS SPECIES    Culture  Abnormal   <100,000 CFU/ml  GARDNERELLA VAGINALIS     Declined urine culture              Objective:          erenumab-aooe (AIMOVIG AUTOINJECTOR) 70 mg/mL injection syringe Inject 1 mL under the skin every 30 days. Indications: migraine prevention    fluconazole (DIFLUCAN) 150 mg tablet Take one tablet by mouth now and repeat in 3 days if needed.    hydroquinone (ELDOQUIN FORTE) 4 % cream Apply  topically to affected area twice daily.    levothyroxine (SYNTHROID) 75 mcg tablet Take one tablet by mouth daily.    nitrofurantoin monohyd/m-cryst (MACROBID) 100 mg capsule Take one capsule by mouth twice daily for 7 days.    other medication Apply to affected areas Topical 2-3 times weekly increase to daily as tolerated for 30 days    rizatriptan (MAXALT) 10 mg tablet Take one tablet by mouth at onset of headache. May repeat after 2 hours. Max of 30 mg in 24 hours.    sertraline (ZOLOFT) 50 mg tablet Take one tablet by mouth daily.    ubrogepant (UBRELVY) 100 mg tablet Take one tablet by mouth daily as needed. May repeat once after 2 hours based on response.  Indications: a migraine headache           Computed Telehealth Body Mass Index unavailable. One or more values for this score either were not found within the given timeframe or did not fit some other criterion.    Physical Exam  Constitutional: Appearance: Normal appearance.      Comments: Virtual Visit completed at [facility]   Eyes:      Extraocular Movements: Extraocular movements intact.   Cardiovascular:      Comments: No distress noted  Pulmonary:      Comments: No cough noted  Musculoskeletal:      Cervical back: Normal range of motion and neck supple.   Neurological:      Mental Status: She is alert and oriented to person, place, and time.              Assessment and Plan:  1. Dysuria        2. Antibiotic-induced yeast infection          Orders Placed This Encounter    cephalexin (KEFLEX) 500 mg capsule    fluconazole (DIFLUCAN) 150 mg tablet        Patient Instructions   Hello [name], Simic that your symptoms resolve quickly.   Thank you,   Hosie Spangle APRN     Take the cephalexin(antibiotic) as prescribed. Eating a yogurt per day such as Activia (or any with acidophilus)  or taking probiotics(buy over the counter at the pharmacy) will decrease the possibility of nausea, diarrhea, and other stomach/intestinal side effects.  If you develop significant diarrhea with the antibiotic, please contact the clinic so we can change to a different antibiotic.      If your symptoms do not improve in 2 days please go to Urgent care, Primary Care Doctor, or OBGYN for further evaluation ( urine culture)       Drink plenty of water and cranberry juice.     If it applies, avoid sexual intercourse until symptoms have resolved and you have finished the antibiotic.     Always urinate immediately after sexual intercourse.      Wipe front to back after going to the bathroom to help prevent future infections.     Do not hold your urine for long periods of time. This can cause bacteria to build up, leading to an infection. Urinate frequently and try to empty your bladder completely.     Go to the emergency room, if you develop temp >101F, vomiting, moderate or severe back or abdominal pain, inability to urinate, dizziness/confusion, or start to feel extremely ill        Vaginal yeast infection induced by antibiotics    Take one Diflucan tablet by mouth as soon as possible. If your symptoms persist more than 7 days after taking the first pill, you can take the second pill.    If you take both pills and symptoms are still present, you should f/up with gynecologist.     You can use over the counter miconazole vaginal cream as needed for the itching- apply to outer areas of vagina.                              minutes spent on this patient's encounter with counseling and coordination of care taking >50% of the visit.

## 2022-11-17 ENCOUNTER — Encounter: Admit: 2022-11-17 | Discharge: 2022-11-17 | Payer: BC Managed Care – PPO

## 2022-11-21 ENCOUNTER — Encounter: Admit: 2022-11-21 | Discharge: 2022-11-21 | Payer: BC Managed Care – PPO

## 2022-11-22 ENCOUNTER — Encounter: Admit: 2022-11-22 | Discharge: 2022-11-22 | Payer: BC Managed Care – PPO

## 2022-11-22 MED FILL — LEVOTHYROXINE 75 MCG PO TAB: 75 mcg | ORAL | 90 days supply | Qty: 90 | Fill #3 | Status: AC

## 2022-11-24 ENCOUNTER — Encounter: Admit: 2022-11-24 | Discharge: 2022-11-24 | Payer: BC Managed Care – PPO

## 2022-12-11 ENCOUNTER — Encounter: Admit: 2022-12-11 | Discharge: 2022-12-11 | Payer: BC Managed Care – PPO

## 2022-12-12 ENCOUNTER — Encounter: Admit: 2022-12-12 | Discharge: 2022-12-12 | Payer: BC Managed Care – PPO

## 2022-12-12 DIAGNOSIS — E039 Hypothyroidism, unspecified: Secondary | ICD-10-CM

## 2022-12-12 DIAGNOSIS — Z0000 Encounter for general adult medical examination without abnormal findings: Secondary | ICD-10-CM

## 2022-12-13 ENCOUNTER — Encounter: Admit: 2022-12-13 | Discharge: 2022-12-13 | Payer: BC Managed Care – PPO

## 2022-12-13 ENCOUNTER — Ambulatory Visit: Admit: 2022-12-13 | Discharge: 2022-12-13 | Payer: BC Managed Care – PPO

## 2022-12-13 DIAGNOSIS — Z8669 Personal history of other diseases of the nervous system and sense organs: Secondary | ICD-10-CM

## 2022-12-13 DIAGNOSIS — R102 Pelvic and perineal pain: Secondary | ICD-10-CM

## 2022-12-13 DIAGNOSIS — F419 Anxiety disorder, unspecified: Secondary | ICD-10-CM

## 2022-12-13 DIAGNOSIS — R109 Unspecified abdominal pain: Secondary | ICD-10-CM

## 2022-12-13 DIAGNOSIS — G43109 Migraine with aura, not intractable, without status migrainosus: Secondary | ICD-10-CM

## 2022-12-13 DIAGNOSIS — E039 Hypothyroidism, unspecified: Secondary | ICD-10-CM

## 2022-12-13 DIAGNOSIS — Z0000 Encounter for general adult medical examination without abnormal findings: Secondary | ICD-10-CM

## 2022-12-13 DIAGNOSIS — Z8619 Personal history of other infectious and parasitic diseases: Secondary | ICD-10-CM

## 2022-12-13 LAB — TSH WITH FREE T4 REFLEX: TSH: 1.1 uU/mL (ref 0.35–5.00)

## 2022-12-13 LAB — CBC AND DIFF
ABSOLUTE BASO COUNT: 0 10*3/uL (ref 0–0.20)
ABSOLUTE EOS COUNT: 0.1 10*3/uL (ref 0–0.45)
ABSOLUTE LYMPH COUNT: 1.3 10*3/uL (ref 1.0–4.8)
ABSOLUTE MONO COUNT: 0.4 10*3/uL (ref 0–0.80)
ABSOLUTE NEUTROPHIL: 2.5 10*3/uL (ref 1.8–7.0)
BASOPHILS %: 0 % (ref 0–2)
EOSINOPHILS %: 3 % (ref >60)
HEMATOCRIT: 43 % (ref 36–45)
HEMOGLOBIN: 14 g/dL (ref 12.0–15.0)
LYMPHOCYTES %: 30 % — ABNORMAL LOW (ref 24–44)
MCH: 29 pg (ref 26–34)
MCHC: 32 g/dL (ref 32.0–36.0)
MCV: 89 FL (ref 80–100)
MONOCYTES %: 10 % (ref 4–12)
MPV: 8 FL (ref 7–11)
NEUTROPHILS %: 57 % (ref 41–77)
PLATELET COUNT: 248 10*3/uL (ref 150–400)
RBC COUNT: 4.8 M/UL (ref 4.0–5.0)
RDW: 12 % (ref 11–15)
WBC COUNT: 4.5 10*3/uL (ref 4.5–11.0)

## 2022-12-13 LAB — URINALYSIS DIPSTICK REFLEX TO CULTURE: URINE PH: 5 (ref 5.0–8.0)

## 2022-12-13 LAB — URINALYSIS MICROSCOPIC REFLEX TO CULTURE

## 2022-12-13 LAB — LIPID PROFILE
CHOLESTEROL: 171 mg/dL (ref <200)
TRIGLYCERIDES: 56 mg/dL (ref <150)

## 2022-12-13 LAB — COMPREHENSIVE METABOLIC PANEL
POTASSIUM: 4 MMOL/L (ref <100)
SODIUM: 140 MMOL/L (ref >40)

## 2022-12-13 MED ORDER — AIMOVIG AUTOINJECTOR 70 MG/ML SC ATIN
70 mg | SUBCUTANEOUS | 3 refills | 28.00000 days | Status: AC
Start: 2022-12-13 — End: ?
  Filled 2023-01-29: qty 3, 90d supply, fill #1

## 2022-12-13 MED ORDER — UBRELVY 100 MG PO TAB
100 mg | ORAL_TABLET | Freq: Every day | ORAL | 3 refills | 30.00000 days | Status: AC | PRN
Start: 2022-12-13 — End: ?

## 2022-12-13 NOTE — Progress Notes
Date of Service: 12/13/2022    Kara Ponce is a 37 y.o. female.  DOB: [birth year]  MRN: [id number]     Subjective:               Patient Reported Other  What topic(s) would you like to cover during your appointment?:  Annual visit  Please describe the issue(s) and history with the issue (location, severity, duration, symptoms, etc.).:  Na  What has been done so far to take care of the issue(s)?:  Na  What are your goals for this visit?:  Na    Chief Complaint   Patient presents with    Physical     Being treated for UTI       Hypothyroidism:   She takes levothyroxine 75 mcg daily.  Migraines:   She takes Aimovig as preventative therapy and Ubrelvy and Maxalt as abortive therapy. Aimovig and Bernita Raisin are helping significantly.  Anxiety:  Sertraline 50mg  every day  Urinary discomfort / pelvic pain:  She has been struggling with urinary and pelvic discomfort. It feels like her bladder is not relaxing. She also struggles with suprapubic and low back pain intermittently. Recently Dx with UTI. Prevoiusly seen by Dr. Lorenz Coaster, who recommended D-mannose. She is questioning if she has Interstitial cystitis.  HLD:  Managed with diet and exercise. We will plan to get a calcium scan at 37yo.  Healthcare maintenance:  -Last Pap:  6 months ago with Dr. Aleda Grana. --> IUD insertion in 2022 with negative PAP.  -Immunizations:  UTD with [employer]    -Exercise:  7 days per week on her Peloton  -Last colonoscopy: Never before. Her dad had a large precanercous polyp --> she will get a colonosccpy at 38yo.          Past Medical History:   Diagnosis Date    Anxiety     History of chicken pox     History of migraine headaches     Hypothyroidism      Surgical History:   Procedure Laterality Date    HX WISDOM TEETH EXTRACTION  05/23/2003    CYSTOSCOPY  08/13/2009    NORMAL    rectal nodule N/A 12/26/2017    Performed by Tempie Hoist, DO at [facility] ENDO    Right cubital tunnel release, possible transposition Right 01/14/2018    Performed by Desiree Hane, MD at [facility] OR    ANORECTAL MANOMETRY N/A 02/15/2018    Performed by Eliott Nine, MD at [facility] ENDO    HX MICROPHLEBECTOMY Left 04/05/2021    left leg microphlebectomy- Dr. Theodoro Grist     Family History   Problem Relation Name Age of Onset    Hypertension Mother      Thyroid Disease Mother      Heart Disease Mother      Hypertension Father      Stroke Father      Hearing Loss Father      High Cholesterol Father      Coronary Artery Disease Father  56        s/p CABG    Colon Polyps Father      Diabetes Maternal Grandfather      Heart Disease Paternal Grandmother      Heart Disease Paternal Grandfather      Anesthetic Complication Neg Hx      Cancer-Breast Neg Hx      Cancer-Colon Neg Hx      Cancer-Ovarian Neg Hx  Cancer-Uterine Neg Hx      Bleeding Disorders Neg Hx      VTE Neg Hx      Cancer Neg Hx       Social History     Socioeconomic History    Marital status: Married     Spouse name: Leighton Parody    Number of children: 2   Occupational History    Occupation: Education administrator: [employer]   Tobacco Use    Smoking status: Never    Smokeless tobacco: Never   Substance and Sexual Activity    Alcohol use: No     Alcohol/week: 1.0 - 2.0 standard drink of alcohol     Types: 1 - 2 Standard drinks or equivalent per week    Drug use: No    Sexual activity: Yes     Partners: Male     Birth control/protection: I.U.D.   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               Review of Systems   Constitutional: Negative for chills, fever, malaise/fatigue, weight gain and weight loss.   HENT:  Negative for congestion and sore throat.    Eyes:  Negative for visual disturbance.   Cardiovascular:  Negative for chest pain, dyspnea on exertion, leg swelling and palpitations.   Respiratory:  Negative for cough and shortness of breath.    Skin:  Negative for rash.   Musculoskeletal:  Negative for joint pain and myalgias.   Gastrointestinal:  Negative for abdominal pain, change in bowel habit, diarrhea, hematochezia, melena, nausea and vomiting.   Genitourinary:  Positive for pelvic pain. Negative for dysuria, frequency and hematuria.   Neurological:  Positive for headaches. Negative for dizziness and light-headedness.   Psychiatric/Behavioral:  Negative for depression. The patient is not nervous/anxious.            Objective:          erenumab-aooe (AIMOVIG AUTOINJECTOR) 70 mg/mL injection syringe Inject 1 mL under the skin every 30 days. Indications: migraine prevention    hydroquinone (ELDOQUIN FORTE) 4 % cream Apply  topically to affected area twice daily.    levothyroxine (SYNTHROID) 75 mcg tablet Take one tablet by mouth daily.    other medication Apply to affected areas Topical 2-3 times weekly increase to daily as tolerated for 30 days    rizatriptan (MAXALT) 10 mg tablet Take one tablet by mouth at onset of headache. May repeat after 2 hours. Max of 30 mg in 24 hours.    sertraline (ZOLOFT) 50 mg tablet Take one tablet by mouth daily.    ubrogepant (UBRELVY) 100 mg tablet Take one tablet by mouth daily as needed. May repeat once after 2 hours based on response.  Indications: a migraine headache     Vitals:    12/13/22 1118   BP: 113/75   Pulse: 71   Temp: 36.9 ?C (98.4 ?F)   Resp: 16   SpO2: 98%   TempSrc: Temporal   Weight: 68.9 kg (152 lb)   Height: 161 cm (5' 3.39)     Body mass index is 26.6  kg/m?.     Physical Exam  Vitals and nursing note reviewed.   Constitutional:       General: She is not in acute distress.     Appearance: She is well-developed.   HENT:      Head: Normocephalic and atraumatic.      Right Ear: Tympanic membrane, ear canal and external ear normal.      Left Ear: Tympanic membrane, ear canal and external ear normal.   Eyes:      Conjunctiva/sclera: Conjunctivae normal.   Neck: Thyroid: No thyromegaly.      Vascular: No carotid bruit.   Cardiovascular:      Rate and Rhythm: Normal rate and regular rhythm.      Heart sounds: Normal heart sounds.   Pulmonary:      Effort: Pulmonary effort is normal. No respiratory distress.      Breath sounds: Normal breath sounds. No wheezing or rales.   Chest:      Chest wall: No tenderness.   Abdominal:      General: Bowel sounds are normal. There is no distension.      Palpations: Abdomen is soft.      Tenderness: There is abdominal tenderness in the suprapubic area. There is no right CVA tenderness or left CVA tenderness.   Musculoskeletal:         General: No tenderness.      Cervical back: Neck supple.   Lymphadenopathy:      Cervical: No cervical adenopathy.   Skin:     General: Skin is warm and dry.   Neurological:      Mental Status: She is alert and oriented to person, place, and time.   Psychiatric:         Behavior: Behavior normal.         Thought Content: Thought content normal.              Assessment and Plan:  Cathlean Cower A. Froio was seen today for physical.    Diagnoses and all orders for this visit:    Routine general medical examination at a health care facility    Hypothyroidism, unspecified type  - Continue Levothyroxine 75 mcg qd    Anxiety  - Continue Zoloft 50mg  every day    Urinary pain  Pelvic pain  Flank pain  - Proceed with Ct abd/pelvis, due to persistent pain despite treatment with recent antibiotics.  - She is questioning if she has Interstitial cystitis.   -     CULTURE-URINE W/SENSITIVITY  -     CT ABD/PELV W CONTRAST; Future; Expected date: 12/13/2022    Migraine with aura and without status migrainosus, not intractable  - Continue Aimovig as preventative therapy and Ubrelvy and Maxalt as abortive therapy.   -     erenumab-aooe (AIMOVIG AUTOINJECTOR) 70 mg/mL injection syringe; Inject 1 mL under the skin every 30 days. Indications: migraine prevention  -     ubrogepant (UBRELVY) 100 mg tablet; Take one tablet by mouth daily as needed. May repeat once after 2 hours based on response.  Indications: a migraine headache      *Reviewed labs collected on 12/13/22.       RTC in 1 year for physical.

## 2022-12-14 ENCOUNTER — Encounter: Admit: 2022-12-14 | Discharge: 2022-12-14 | Payer: BC Managed Care – PPO

## 2023-01-09 ENCOUNTER — Encounter: Admit: 2023-01-09 | Discharge: 2023-01-09 | Payer: BC Managed Care – PPO

## 2023-01-09 DIAGNOSIS — F419 Anxiety disorder, unspecified: Secondary | ICD-10-CM

## 2023-01-09 MED ORDER — SERTRALINE 50 MG PO TAB
50 mg | ORAL_TABLET | Freq: Every day | ORAL | 0 refills | Status: AC
Start: 2023-01-09 — End: ?
  Filled 2023-01-10: qty 90, 90d supply, fill #1

## 2023-01-10 ENCOUNTER — Encounter: Admit: 2023-01-10 | Discharge: 2023-01-10 | Payer: BC Managed Care – PPO

## 2023-01-10 MED FILL — RIZATRIPTAN 10 MG PO TAB: 10 mg | 30 days supply | Qty: 10 | Fill #3 | Status: AC

## 2023-01-13 ENCOUNTER — Encounter: Admit: 2023-01-13 | Discharge: 2023-01-13 | Payer: BC Managed Care – PPO

## 2023-01-16 ENCOUNTER — Encounter: Admit: 2023-01-16 | Discharge: 2023-01-16 | Payer: BC Managed Care – PPO

## 2023-01-16 NOTE — Progress Notes
I completed a peer to peer about CT abdomen pelvis that was ordered for this patient. The CT was approved until 02/02/23.     Authorization #:  [id number]

## 2023-01-19 ENCOUNTER — Encounter: Admit: 2023-01-19 | Discharge: 2023-01-19 | Payer: BC Managed Care – PPO

## 2023-01-25 ENCOUNTER — Encounter: Admit: 2023-01-25 | Discharge: 2023-01-25 | Payer: BC Managed Care – PPO

## 2023-01-26 ENCOUNTER — Encounter: Admit: 2023-01-26 | Discharge: 2023-01-26 | Payer: BC Managed Care – PPO

## 2023-01-28 ENCOUNTER — Encounter: Admit: 2023-01-28 | Discharge: 2023-01-28 | Payer: BC Managed Care – PPO

## 2023-01-28 MED FILL — LEVOTHYROXINE 75 MCG PO TAB: 75 mcg | ORAL | 90 days supply | Qty: 90 | Fill #4 | Status: AC

## 2023-02-28 ENCOUNTER — Encounter: Admit: 2023-02-28 | Discharge: 2023-02-28 | Payer: BC Managed Care – PPO

## 2023-03-01 ENCOUNTER — Encounter: Admit: 2023-03-01 | Discharge: 2023-03-01 | Payer: BC Managed Care – PPO

## 2023-03-12 ENCOUNTER — Encounter: Admit: 2023-03-12 | Discharge: 2023-03-12 | Payer: BC Managed Care – PPO

## 2023-03-21 ENCOUNTER — Encounter: Admit: 2023-03-21 | Discharge: 2023-03-21 | Payer: BC Managed Care – PPO

## 2023-03-22 ENCOUNTER — Encounter: Admit: 2023-03-22 | Discharge: 2023-03-22 | Payer: BC Managed Care – PPO

## 2023-03-22 MED FILL — CEPHALEXIN 500 MG PO CAP: 500 mg | ORAL | 7 days supply | Qty: 21 | Fill #1 | Status: CP

## 2023-03-22 MED FILL — FLUCONAZOLE 150 MG PO TAB: 150 mg | 4 days supply | Qty: 2 | Fill #1 | Status: CP

## 2023-03-28 ENCOUNTER — Encounter: Admit: 2023-03-28 | Discharge: 2023-03-28 | Payer: BC Managed Care – PPO

## 2023-03-30 ENCOUNTER — Encounter: Admit: 2023-03-30 | Discharge: 2023-03-30 | Payer: BC Managed Care – PPO

## 2023-03-31 ENCOUNTER — Encounter: Admit: 2023-03-31 | Discharge: 2023-03-31 | Payer: BC Managed Care – PPO

## 2023-03-31 MED FILL — RIZATRIPTAN 10 MG PO TAB: 10 mg | 30 days supply | Qty: 10 | Fill #4 | Status: AC

## 2023-04-01 ENCOUNTER — Encounter: Admit: 2023-04-01 | Discharge: 2023-04-01 | Payer: BC Managed Care – PPO

## 2023-04-01 MED FILL — UBRELVY 100 MG PO TAB: 100 mg | ORAL | 30 days supply | Qty: 10 | Fill #1 | Status: AC

## 2023-04-03 ENCOUNTER — Encounter: Admit: 2023-04-03 | Discharge: 2023-04-03 | Payer: BC Managed Care – PPO

## 2023-04-03 MED ORDER — PREDNISONE 50 MG PO TAB
50 mg | ORAL_TABLET | Freq: Every day | ORAL | 0 refills | Status: AC
Start: 2023-04-03 — End: ?
  Filled 2023-04-04: qty 5, 5d supply, fill #1

## 2023-04-04 ENCOUNTER — Encounter: Admit: 2023-04-04 | Discharge: 2023-04-04 | Payer: BC Managed Care – PPO

## 2023-04-05 ENCOUNTER — Encounter: Admit: 2023-04-05 | Discharge: 2023-04-05 | Payer: BC Managed Care – PPO

## 2023-04-29 ENCOUNTER — Encounter: Admit: 2023-04-29 | Discharge: 2023-04-29 | Payer: BC Managed Care – PPO

## 2023-04-30 ENCOUNTER — Encounter: Admit: 2023-04-30 | Discharge: 2023-04-30 | Payer: BC Managed Care – PPO

## 2023-05-01 ENCOUNTER — Encounter: Admit: 2023-05-01 | Discharge: 2023-05-01 | Payer: BC Managed Care – PPO

## 2023-05-01 MED FILL — SERTRALINE 50 MG PO TAB: 50 mg | ORAL | 90 days supply | Qty: 90 | Fill #1 | Status: AC

## 2023-05-01 MED FILL — LEVOTHYROXINE 75 MCG PO TAB: 75 mcg | ORAL | 90 days supply | Qty: 90 | Fill #1 | Status: AC

## 2023-05-03 ENCOUNTER — Encounter: Admit: 2023-05-03 | Discharge: 2023-05-03 | Payer: BC Managed Care – PPO

## 2023-05-04 ENCOUNTER — Encounter: Admit: 2023-05-04 | Discharge: 2023-05-04 | Payer: BC Managed Care – PPO

## 2023-05-04 MED FILL — AIMOVIG AUTOINJECTOR 70 MG/ML SC ATIN: 70 mg/mL | SUBCUTANEOUS | 90 days supply | Qty: 3 | Fill #2 | Status: AC

## 2023-05-11 ENCOUNTER — Encounter: Admit: 2023-05-11 | Discharge: 2023-05-11 | Payer: BC Managed Care – PPO

## 2023-05-15 ENCOUNTER — Encounter: Admit: 2023-05-15 | Discharge: 2023-05-15 | Payer: BC Managed Care – PPO

## 2023-05-15 MED FILL — TRIAMCINOLONE ACETONIDE 0.1 % TP CREA: 0.1 % | TOPICAL | 30 days supply | Qty: 30 | Fill #1 | Status: AC

## 2023-06-01 ENCOUNTER — Encounter: Admit: 2023-06-01 | Discharge: 2023-06-01 | Payer: BC Managed Care – PPO

## 2023-06-01 MED FILL — HYDROCODONE-ACETAMINOPHEN 5-325 MG PO TAB: 5325 mg | ORAL | 2 days supply | Qty: 10 | Fill #1 | Status: CP

## 2023-06-01 MED FILL — AMOXICILLIN 500 MG PO CAP: 500 mg | ORAL | 7 days supply | Qty: 21 | Fill #1 | Status: CP

## 2023-06-01 MED FILL — ONDANSETRON 8 MG PO TBDI: 8 mg | ORAL | 3 days supply | Qty: 6 | Fill #1 | Status: CP

## 2023-06-24 ENCOUNTER — Encounter: Admit: 2023-06-24 | Discharge: 2023-06-24 | Payer: BC Managed Care – PPO

## 2023-06-25 ENCOUNTER — Encounter: Admit: 2023-06-25 | Discharge: 2023-06-25 | Payer: BC Managed Care – PPO

## 2023-06-25 MED FILL — UBRELVY 100 MG PO TAB: 100 mg | ORAL | 30 days supply | Qty: 10 | Fill #2 | Status: AC

## 2023-06-28 ENCOUNTER — Encounter: Admit: 2023-06-28 | Discharge: 2023-06-28 | Payer: BC Managed Care – PPO

## 2023-07-20 ENCOUNTER — Encounter: Admit: 2023-07-20 | Discharge: 2023-07-20 | Payer: BC Managed Care – PPO

## 2023-07-22 ENCOUNTER — Encounter: Admit: 2023-07-22 | Discharge: 2023-07-22 | Payer: BC Managed Care – PPO

## 2023-07-23 MED FILL — AIMOVIG AUTOINJECTOR 70 MG/ML SC ATIN: 70 mg/mL | SUBCUTANEOUS | 90 days supply | Qty: 3 | Fill #3 | Status: AC

## 2023-08-01 ENCOUNTER — Encounter: Admit: 2023-08-01 | Discharge: 2023-08-01 | Payer: BC Managed Care – PPO

## 2023-08-01 MED ORDER — GABAPENTIN 100 MG PO CAP
100 mg | ORAL_CAPSULE | ORAL | 0 refills | Status: CN
Start: 2023-08-01 — End: ?

## 2023-08-02 ENCOUNTER — Encounter: Admit: 2023-08-02 | Discharge: 2023-08-02 | Payer: BC Managed Care – PPO

## 2023-08-03 MED FILL — GABAPENTIN 100 MG PO CAP: 100 mg | ORAL | 30 days supply | Qty: 90 | Fill #1 | Status: AC

## 2023-08-06 ENCOUNTER — Encounter: Admit: 2023-08-06 | Discharge: 2023-08-06 | Payer: BC Managed Care – PPO

## 2023-08-06 MED FILL — LEVOTHYROXINE 75 MCG PO TAB: 75 mcg | ORAL | 90 days supply | Qty: 90 | Fill #2 | Status: AC

## 2023-09-04 ENCOUNTER — Encounter: Admit: 2023-09-04 | Discharge: 2023-09-04 | Payer: BLUE CROSS/BLUE SHIELD

## 2023-09-05 ENCOUNTER — Encounter: Admit: 2023-09-05 | Discharge: 2023-09-05 | Payer: BLUE CROSS/BLUE SHIELD

## 2023-09-05 MED FILL — UBRELVY 100 MG PO TAB: 100 mg | ORAL | 30 days supply | Qty: 10 | Fill #3 | Status: AC

## 2023-09-05 NOTE — Telephone Encounter
 Received a VM from [facility] stating that they received the Rx for Maxalt  10 mg tabs that was sent today but that is currently on a manufacturer backorder, so unsure when the next supply will be sent.  She does state that they have the 10 mg disintegrating or the 5 mg tabs available, however.    Called the pharmacy back to inquire if there is a known difference in price for the available options.  Per Pharmacist, the 10 mg tabs would be $7.97, the 10 mg disintegrating tabs would be $12.40, and for the 5 mg tabs, insurance will only allow a maximum of 12 tabs at a time, which would be $11.27.  Okay given to change the prescription to the 10 mg ODT form.  Pharmacy will contact the pt to notify her of the change in form and cost.

## 2023-09-06 MED FILL — RIZATRIPTAN 10 MG PO TBDI: 10 mg | 5 days supply | Qty: 10 | Fill #1 | Status: AC

## 2023-10-19 ENCOUNTER — Encounter: Admit: 2023-10-19 | Discharge: 2023-10-19 | Payer: BLUE CROSS/BLUE SHIELD

## 2023-10-21 ENCOUNTER — Encounter: Admit: 2023-10-21 | Discharge: 2023-10-21 | Payer: BLUE CROSS/BLUE SHIELD

## 2023-10-22 ENCOUNTER — Encounter: Admit: 2023-10-22 | Discharge: 2023-10-22 | Payer: BLUE CROSS/BLUE SHIELD

## 2023-10-22 MED FILL — UBRELVY 100 MG PO TAB: 100 mg | ORAL | 30 days supply | Qty: 10 | Fill #4 | Status: AC

## 2023-10-22 MED FILL — TRIAMCINOLONE ACETONIDE 0.1 % TP CREA: 0.1 % | TOPICAL | 30 days supply | Qty: 30 | Fill #2 | Status: AC

## 2023-10-23 ENCOUNTER — Encounter: Admit: 2023-10-23 | Discharge: 2023-10-23 | Payer: BLUE CROSS/BLUE SHIELD

## 2023-10-23 MED FILL — AIMOVIG AUTOINJECTOR 70 MG/ML SC ATIN: 70 mg/mL | SUBCUTANEOUS | 90 days supply | Qty: 3 | Fill #4 | Status: AC

## 2023-11-06 ENCOUNTER — Encounter: Admit: 2023-11-06 | Discharge: 2023-11-06 | Payer: BLUE CROSS/BLUE SHIELD

## 2023-11-06 DIAGNOSIS — G43009 Migraine without aura, not intractable, without status migrainosus: Secondary | ICD-10-CM

## 2023-11-06 DIAGNOSIS — F419 Anxiety disorder, unspecified: Secondary | ICD-10-CM

## 2023-11-06 MED ORDER — SERTRALINE 50 MG PO TAB
50 mg | ORAL_TABLET | Freq: Every day | ORAL | 0 refills | 30.00000 days | Status: AC
Start: 2023-11-06 — End: ?
  Filled 2023-11-08: qty 90, 90d supply, fill #1

## 2023-11-06 MED ORDER — RIZATRIPTAN 10 MG PO TBDI
ORAL_TABLET | ORAL | 0 refills | 30.00000 days | Status: AC
Start: 2023-11-06 — End: ?
  Filled 2023-11-08: qty 10, 5d supply, fill #1

## 2023-11-07 ENCOUNTER — Encounter: Admit: 2023-11-07 | Discharge: 2023-11-07 | Payer: BLUE CROSS/BLUE SHIELD

## 2023-11-07 MED FILL — LEVOTHYROXINE 75 MCG PO TAB: 75 mcg | ORAL | 90 days supply | Qty: 90 | Fill #3 | Status: AC

## 2023-11-08 ENCOUNTER — Encounter: Admit: 2023-11-08 | Discharge: 2023-11-08 | Payer: BLUE CROSS/BLUE SHIELD

## 2023-12-12 ENCOUNTER — Encounter: Admit: 2023-12-12 | Discharge: 2023-12-12 | Payer: BLUE CROSS/BLUE SHIELD

## 2023-12-12 DIAGNOSIS — Z0000 Encounter for general adult medical examination without abnormal findings: Principal | ICD-10-CM

## 2023-12-19 ENCOUNTER — Ambulatory Visit: Admit: 2023-12-19 | Discharge: 2023-12-19 | Payer: BLUE CROSS/BLUE SHIELD

## 2023-12-19 ENCOUNTER — Ambulatory Visit: Admit: 2023-12-19 | Discharge: 2023-12-20 | Payer: BLUE CROSS/BLUE SHIELD

## 2023-12-19 ENCOUNTER — Encounter: Admit: 2023-12-19 | Discharge: 2023-12-19 | Payer: BLUE CROSS/BLUE SHIELD

## 2023-12-20 ENCOUNTER — Encounter: Admit: 2023-12-20 | Discharge: 2023-12-20 | Payer: BLUE CROSS/BLUE SHIELD

## 2023-12-20 NOTE — Progress Notes
 Date of Service: 12/21/2023    Kara Ponce is a 38 y.o. female.  DOB: 04-07-1986  MRN: [id number]     Subjective:               Patient Reported Other  What topic(s) would you like to cover during your appointment?:  Annual visit  Please describe the issue(s) and history with the issue (location, severity, duration, symptoms, etc.).:  Na  What has been done so far to take care of the issue(s)?:  Na  What are your goals for this visit?:  Na    Chief Complaint   Patient presents with    Physical          Kara Ponce is a 38 year old female who presents with urinary discomfort and high cholesterol.    She experiences chronic intermittent urinary discomfort. UA negative this week. She thinks she has interstitial cystitis with flare-ups, causing discomfort without urgency. Urination remains normal. She manages symptoms by avoiding certain foods and taking D-mannose, cranberry, and vitamin C supplements.    Her cholesterol levels are elevated. She has a familial history of high cholesterol and heart disease, with her father having undergone coronary artery bypass grafting and both grandparents having died from heart-related issues. She is concerned about her cholesterol levels despite maintaining a healthy lifestyle.    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 She inquires if this could be familial, as she has no other abnormal cell lines.       Chronic problems:  Hypothyroidism:   She takes levothyroxine  75 mcg daily.  Migraines:   She takes Aimovig  as preventative therapy and Ubrelvy  and Maxalt  as abortive therapy. Aimovig  and Ubrelvy  are helping significantly.  Anxiety:  Sertraline  50mg  every day  HLD:  Managed with diet and exercise. We will plan to get a calcium scan at 38yo.  Healthcare maintenance:  -Last Pap:  6 months ago with Dr. Corene. --> IUD insertion in 2022 with negative PAP.  -Last mammogram:  Never before - ordered  -Immunizations:  UTD with [employer]    -Exercise:  7 days per week on her Peloton  -Last colonoscopy: Never before. Her dad had a large precanercous polyp --> she will get a colonosccpy at 38yo.          Past Medical History:    Anxiety    History of chicken pox    History of migraine headaches    Hypothyroidism     Surgical History:   Procedure Laterality Date    HX WISDOM TEETH EXTRACTION  05/23/2003    CYSTOSCOPY  08/13/2009    NORMAL    rectal nodule N/A 12/26/2017    Performed by Leonce Emerick PARAS, DO at [facility] ENDO    Right cubital tunnel release, possible transposition Right 01/14/2018    Performed by Zada FORBES Pin, MD at [facility] OR    ANORECTAL MANOMETRY N/A 02/15/2018    Performed by Noralyn Blossom, MD at [facility] ENDO    HX MICROPHLEBECTOMY Left 04/05/2021    left leg microphlebectomy- Dr. Lenton    ELBOW SURGERY  Right ulnar nerve decompression August 26,2019    HX CARPAL TUNNEL RELEASE       Family History   Problem Relation Name Age of Onset    Hypertension Mother      Thyroid Disease Mother      Heart Disease Mother      Hypertension  Father      Stroke Father      Hearing Loss Father      High Cholesterol Father      Coronary Artery Disease Father  72        s/p CABG    Colon Polyps Father      Diabetes Maternal Grandfather      Heart Disease Paternal Grandmother      Heart Disease Paternal Grandfather      Anesthetic Complication Neg Hx      Cancer-Breast Neg Hx      Cancer-Colon Neg Hx      Cancer-Ovarian Neg Hx      Cancer-Uterine Neg Hx      Bleeding Disorders Neg Hx      VTE Neg Hx      Cancer Neg Hx       Social History     Socioeconomic History    Marital status: Married     Spouse name: Electronics engineer    Number of children: 2   Occupational History    Occupation: Education administrator:  [employer]   Tobacco Use    Smoking status: Never    Smokeless tobacco: Never   Substance and Sexual Activity    Alcohol use: No     Alcohol/week: 1.0 - 2.0 standard drink of alcohol     Types: 1 - 2 Standard drinks or equivalent per week    Drug use: No Sexual activity: Yes     Partners: Male     Birth control/protection: I.U.D.   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                       Objective:          erenumab -aooe (AIMOVIG  AUTOINJECTOR) 70 mg/mL injection syringe Inject 1 mL under the skin every 30 days. Indications: migraine prevention    hydroquinone  (ELDOQUIN FORTE) 4 % cream Apply  topically to affected area twice daily.    levothyroxine  (SYNTHROID ) 75 mcg tablet Take one tablet by mouth daily.    other medication Apply to affected areas Topical 2-3 times weekly increase to daily as tolerated for 30 days    rizatriptan  (MAXALT -MLT) 10 mg rapid dissolve tablet Dissolve one tablet by mouth at onset of headache. May repeat after 2 hours. Max of 30 mg in 24 hours.    sertraline  (ZOLOFT ) 50 mg tablet Take one tablet by mouth daily.    triamcinolone  acetonide (KENALOG ) 0.1 % topical cream Apply  topically to affected area twice daily up to 4 weeks when flared    ubrogepant  (UBRELVY ) 100 mg tablet Take one tablet by mouth daily as needed. May repeat once after 2 hours based on response.  Indications: a migraine headache     Vitals:    12/21/23 0823   BP: 115/75   Pulse: 73   Resp: 16   SpO2: 100%   Weight: 68.9 kg (152 lb)   Height: 160 cm (5' 3)       Body mass index is 26.93 kg/m?SABRA     Physical Exam  Vitals and  nursing note reviewed.   Constitutional:       General: She is not in acute distress.     Appearance: She is well-developed.   HENT:      Head: Normocephalic and atraumatic.      Right Ear: Tympanic membrane, ear canal and external ear normal.      Left Ear: Tympanic membrane, ear canal and external ear normal.   Eyes:      Conjunctiva/sclera: Conjunctivae normal.   Neck:      Thyroid: No thyromegaly.      Vascular: No carotid bruit.   Cardiovascular:      Rate and Rhythm: Normal rate and regular rhythm.      Heart sounds: Normal heart sounds.   Pulmonary:      Effort: Pulmonary effort is normal. No respiratory distress.      Breath sounds: Normal breath sounds. No wheezing or rales.   Chest:      Chest wall: No tenderness.   Abdominal:      General: Bowel sounds are normal. There is no distension.      Palpations: Abdomen is soft.      Tenderness: There is no abdominal tenderness.   Musculoskeletal:         General: No tenderness.      Cervical back: Neck supple.   Lymphadenopathy:      Cervical: No cervical adenopathy.   Skin:     General: Skin is warm and dry.   Neurological:      Mental Status: She is alert and oriented to person, place, and time.   Psychiatric:         Behavior: Behavior normal.         Thought Content: Thought content normal.              Assessment and Plan:  Kara Ponce was seen today for physical.    Diagnoses and all orders for this visit:    Routine general medical examination at a health care facility    Hypothyroidism, unspecified type  - Continue Levothyroxine  75 mcg qd    Anxiety  - Continue Zoloft  50mg  every day    Migraine with aura and without status migrainosus, not intractable  - Continue Aimovig  as preventative therapy and Ubrelvy  and Maxalt  as abortive therapy.   - Avoid estrogen-containing medications.  - Continue current migraine management regimen.  -     erenumab -aooe (AIMOVIG  AUTOINJECTOR) 70 mg/mL injection syringe; Inject 1 mL under the skin every 30 days. Indications: migraine prevention  -     ubrogepant  (UBRELVY ) 100 mg tablet; Take one tablet by mouth daily as needed. May repeat once after 2 hours based on response.  Indications: a migraine headache      Interstitial cystitis  Chronic condition with intermittent flare-ups, managed with dietary modifications and supplements.  - Continue interstitial cystitis diet, avoiding spicy foods.  - Continue D-mannose, cranberry, and vitamin C supplements.    Hypercholesterolemia with strong family history of premature coronary artery disease  Mildly elevated cholesterol with significant family history. Considered coronary calcium scan to assess risk and guide treatment.  - Order coronary calcium scan.  - Monitor cholesterol levels.  - Consider statin therapy if calcium score is high.    Menopausal symptoms  Potential early symptoms discussed. Hormone therapy options reviewed. Avoided estrogen due to migraine with aura.  - Refer to menopause clinic for evaluation and management.  - Discuss hormone therapy options, including patches and creams, avoiding oral  contraceptives containing estrogen due to hx of migraines with aura.            *Reviewed labs collected on 12/19/23.       RTC in 1 year for physical.

## 2023-12-21 ENCOUNTER — Encounter: Admit: 2023-12-21 | Discharge: 2023-12-21 | Payer: BLUE CROSS/BLUE SHIELD

## 2023-12-21 ENCOUNTER — Ambulatory Visit: Admit: 2023-12-21 | Discharge: 2023-12-22 | Payer: BLUE CROSS/BLUE SHIELD

## 2023-12-21 ENCOUNTER — Ambulatory Visit: Admit: 2023-12-21 | Discharge: 2023-12-21 | Payer: BLUE CROSS/BLUE SHIELD

## 2023-12-21 DIAGNOSIS — Z0000 Encounter for general adult medical examination without abnormal findings: Principal | ICD-10-CM

## 2023-12-21 DIAGNOSIS — Z1231 Encounter for screening mammogram for malignant neoplasm of breast: Secondary | ICD-10-CM

## 2023-12-21 DIAGNOSIS — F419 Anxiety disorder, unspecified: Secondary | ICD-10-CM

## 2023-12-21 DIAGNOSIS — E039 Hypothyroidism, unspecified: Secondary | ICD-10-CM

## 2023-12-21 DIAGNOSIS — G43009 Migraine without aura, not intractable, without status migrainosus: Secondary | ICD-10-CM

## 2023-12-21 DIAGNOSIS — R5383 Other fatigue: Secondary | ICD-10-CM

## 2023-12-22 DIAGNOSIS — Z136 Encounter for screening for cardiovascular disorders: Secondary | ICD-10-CM

## 2023-12-22 DIAGNOSIS — R6882 Decreased libido: Secondary | ICD-10-CM

## 2023-12-22 DIAGNOSIS — E7849 Other hyperlipidemia: Secondary | ICD-10-CM

## 2023-12-24 ENCOUNTER — Encounter: Admit: 2023-12-24 | Discharge: 2023-12-24 | Payer: BLUE CROSS/BLUE SHIELD

## 2023-12-28 ENCOUNTER — Encounter: Admit: 2023-12-28 | Discharge: 2023-12-28 | Payer: BLUE CROSS/BLUE SHIELD

## 2024-01-02 ENCOUNTER — Encounter: Admit: 2024-01-02 | Discharge: 2024-01-02 | Payer: BLUE CROSS/BLUE SHIELD

## 2024-01-09 ENCOUNTER — Encounter: Admit: 2024-01-09 | Discharge: 2024-01-09 | Payer: BLUE CROSS/BLUE SHIELD

## 2024-01-16 ENCOUNTER — Encounter: Admit: 2024-01-16 | Discharge: 2024-01-16 | Payer: BLUE CROSS/BLUE SHIELD

## 2024-01-16 DIAGNOSIS — G43009 Migraine without aura, not intractable, without status migrainosus: Secondary | ICD-10-CM

## 2024-01-16 DIAGNOSIS — G43109 Migraine with aura, not intractable, without status migrainosus: Principal | ICD-10-CM

## 2024-01-16 MED ORDER — RIZATRIPTAN 10 MG PO TBDI
ORAL_TABLET | ORAL | 0 refills | 30.00000 days | Status: AC
Start: 2024-01-16 — End: ?
  Filled 2024-01-20: qty 10, 4d supply, fill #0

## 2024-01-16 MED ORDER — UBRELVY 100 MG PO TAB
100 mg | ORAL_TABLET | Freq: Every day | ORAL | 3 refills | 30.00000 days | Status: AC | PRN
Start: 2024-01-16 — End: ?
  Filled 2024-01-20: qty 10, 5d supply, fill #0

## 2024-01-16 MED ORDER — AIMOVIG AUTOINJECTOR 70 MG/ML SC ATIN
70 mg | SUBCUTANEOUS | 3 refills | 28.00000 days | Status: AC
Start: 2024-01-16 — End: ?
  Filled 2024-01-22: qty 3, 90d supply, fill #0

## 2024-01-17 ENCOUNTER — Encounter: Admit: 2024-01-17 | Discharge: 2024-01-17 | Payer: BLUE CROSS/BLUE SHIELD

## 2024-01-17 ENCOUNTER — Ambulatory Visit: Admit: 2024-01-17 | Discharge: 2024-01-18 | Payer: BLUE CROSS/BLUE SHIELD

## 2024-01-18 ENCOUNTER — Encounter: Admit: 2024-01-18 | Discharge: 2024-01-18 | Payer: BLUE CROSS/BLUE SHIELD

## 2024-01-19 ENCOUNTER — Encounter: Admit: 2024-01-19 | Discharge: 2024-01-19 | Payer: BLUE CROSS/BLUE SHIELD

## 2024-01-20 ENCOUNTER — Ambulatory Visit: Admit: 2024-01-20 | Discharge: 2024-01-20 | Payer: BLUE CROSS/BLUE SHIELD

## 2024-01-20 ENCOUNTER — Encounter: Admit: 2024-01-20 | Discharge: 2024-01-20 | Payer: BLUE CROSS/BLUE SHIELD

## 2024-01-21 ENCOUNTER — Encounter: Admit: 2024-01-21 | Discharge: 2024-01-21 | Payer: BLUE CROSS/BLUE SHIELD

## 2024-01-22 ENCOUNTER — Encounter: Admit: 2024-01-22 | Discharge: 2024-01-22 | Payer: BLUE CROSS/BLUE SHIELD

## 2024-01-25 ENCOUNTER — Encounter: Admit: 2024-01-25 | Discharge: 2024-01-25 | Payer: BLUE CROSS/BLUE SHIELD

## 2024-01-25 DIAGNOSIS — R92343 Extremely dense tissue of both breasts on mammography: Principal | ICD-10-CM

## 2024-02-01 ENCOUNTER — Encounter: Admit: 2024-02-01 | Discharge: 2024-02-01 | Payer: BLUE CROSS/BLUE SHIELD

## 2024-02-01 ENCOUNTER — Ambulatory Visit: Admit: 2024-02-01 | Discharge: 2024-02-01 | Payer: BLUE CROSS/BLUE SHIELD

## 2024-02-01 MED ORDER — PROPOFOL INJ 10 MG/ML IV VIAL
INTRAVENOUS | 0 refills | Status: DC
Start: 2024-02-01 — End: 2024-02-01

## 2024-02-01 MED ORDER — KETOROLAC 30 MG/ML (1 ML) IJ SOLN
INTRAVENOUS | 0 refills | Status: DC
Start: 2024-02-01 — End: 2024-02-01

## 2024-02-01 MED ORDER — ONDANSETRON HCL (PF) 4 MG/2 ML IJ SOLN
INTRAVENOUS | 0 refills | Status: DC
Start: 2024-02-01 — End: 2024-02-01

## 2024-02-01 MED ORDER — PROPOFOL 10 MG/ML IV EMUL 100 ML (INFUSION)(AM)(OR)
INTRAVENOUS | 0 refills | Status: DC
Start: 2024-02-01 — End: 2024-02-01
  Administered 2024-02-01: 19:00:00 150 ug/kg/min via INTRAVENOUS

## 2024-02-01 MED ORDER — FENTANYL CITRATE (PF) 50 MCG/ML IJ SOLN
INTRAVENOUS | 0 refills | Status: DC
Start: 2024-02-01 — End: 2024-02-01

## 2024-02-01 MED ORDER — LIDOCAINE (PF) 200 MG/10 ML (2 %) IJ SYRG
INTRAVENOUS | 0 refills | Status: DC
Start: 2024-02-01 — End: 2024-02-01

## 2024-02-01 MED ORDER — CEFAZOLIN 1 GRAM IJ SOLR
INTRAVENOUS | 0 refills | Status: DC
Start: 2024-02-01 — End: 2024-02-01

## 2024-02-01 MED ORDER — DEXAMETHASONE SODIUM PHOSPHATE 4 MG/ML IJ SOLN
INTRAVENOUS | 0 refills | Status: DC
Start: 2024-02-01 — End: 2024-02-01

## 2024-02-01 MED ORDER — ACETAMINOPHEN 1,000 MG/100 ML (10 MG/ML) IV SOLN
INTRAVENOUS | 0 refills | Status: DC
Start: 2024-02-01 — End: 2024-02-01

## 2024-02-01 MED ADMIN — LACTATED RINGERS IV BOLUS [214599]: 1000 mL | INTRAVENOUS | @ 19:00:00 | Stop: 2024-02-02 | NDC 00338011704

## 2024-02-01 MED ADMIN — APREPITANT 40 MG PO CAP [129507]: 40 mg | ORAL | @ 19:00:00 | Stop: 2024-02-01 | NDC 13668059180

## 2024-02-01 MED FILL — OXYCODONE-ACETAMINOPHEN 5-325 MG PO TAB: 5-325 mg | ORAL | 5 days supply | Qty: 30 | Fill #1 | Status: CP

## 2024-02-01 MED FILL — IBUPROFEN 400 MG PO TAB: 400 mg | ORAL | 8 days supply | Qty: 30 | Fill #1 | Status: CP

## 2024-02-01 MED FILL — ONDANSETRON 4 MG PO TBDI: 4 mg | ORAL | 8 days supply | Qty: 30 | Fill #1 | Status: CP

## 2024-02-01 MED FILL — METHYLPREDNISOLONE 4 MG PO DSPK: 4 mg | 6 days supply | Qty: 21 | Fill #1 | Status: CP

## 2024-02-01 NOTE — Anesthesia Pre-Procedure Evaluation
 Anesthesia Pre-Procedure Evaluation    Name: Kara Ponce      MRN: [id number]     DOB: 1986/01/26     Age: 38 y.o.     Sex: female   _________________________________________________________________________     Procedure Info:   Procedure Information       Date/Time: 02/01/24 1443    Procedures:       Right open carpal tunnel release (Right) - **      Right ring finger cyst excision (Right)      OPEN UMBILICAL HERNIA REPAIR WITH MESH - **    Location: [facility] / [facility] OR/PERIOP    Surgeons: Carlean Lang ORN, MD; Shirlean Norleen JONELLE Mickey., MD            Physical Assessment  Vital Signs (last filed in past 24 hours):        No results found for: GLUPOC   Patient History   Allergies[1]     Current Medications   Medication Directions   erenumab -aooe (AIMOVIG  AUTOINJECTOR) 70 mg/mL injection syringe Inject 1 mL under the skin every 30 days. Indications: migraine prevention   hydroquinone  (ELDOQUIN FORTE) 4 % cream Apply  topically to affected area twice daily.   levothyroxine  (SYNTHROID ) 75 mcg tablet Take one tablet by mouth daily.   MULTIVITAMIN PO Take  by mouth.   other medication Apply to affected areas Topical 2-3 times weekly increase to daily as tolerated for 30 days   rizatriptan  (MAXALT -MLT) 10 mg rapid dissolve tablet Dissolve one tablet by mouth at onset of headache. May repeat after 2 hours. Max of 30 mg in 24 hours.   sertraline  (ZOLOFT ) 50 mg tablet Take one tablet by mouth daily.   triamcinolone  acetonide (KENALOG ) 0.1 % topical cream Apply  topically to affected area twice daily up to 4 weeks when flared   ubrogepant  (UBRELVY ) 100 mg tablet Take one tablet by mouth daily as needed. May repeat once after 2 hours based on response.  Indications: a migraine headache       Review of Systems/Medical History      Patient summary reviewed  Nursing notes reviewed  Pertinent labs reviewed    PONV Screening: Non-smoker, Postoperative opioids and Female sex    No history of anesthetic complications  No family history of anesthetic complications      Airway - negative        Pulmonary       Not a current smoker        No indications/hx of asthma      no COPD         No recent URI      Cardiovascular - negative        Exercise tolerance: >4 METS      Beta Blocker therapy: No      Beta blockers within 24 hours: n/a        No CIED                                                                                                No hypertension  No valvular problems/murmurs          No past MI      No hx of coronary artery disease        No PTCA            No dysrhythmias    No angina      No indications/hx of CHF        No dyspnea on exertion      GI/Hepatic/Renal - negative            No GERD        No liver disease:         No renal disease:           Neuro/Psych       No seizures      No hx neuromuscular disease        No CVA      Headaches      Neuropathy        Psychiatric history          Anxiety      Musculoskeletal - negative        No neck pain        Endocrine/Other       No diabetes        Hypothyroidism      No anemia        No autoimmune disease      Constitution - negative       Physical Exam    Airway Findings      Mallampati: I      TM distance: >3 FB      Neck ROM: full      Mouth opening: good      Airway patency: adequate    Dental Findings: Negative      Cardiovascular Findings:       Rhythm: regular      Rate: normal      No murmur    Pulmonary Findings:       Breath sounds clear to auscultation.    Neurological Findings:       Alert and oriented x 3    Constitutional findings:       No acute distress      Well-developed      Well-nourished       Previous Airway Procedure Notes Displaying the 3 most recent records   No records found.         Patient Lines/Drains/Airways Status       Active Lines:       None                  Diagnostic Tests  Hematology:   Lab Results   Component Value Date    HGB 14.4 12/19/2023    HCT 42.4 12/19/2023    PLTCT 225 12/19/2023    WBC 4.10 (L) 12/19/2023    NEUT 54.4 12/19/2023    ANC 2.20 12/19/2023    ALC 1.30 12/19/2023    MONA 10.5 12/19/2023    AMC 0.40 12/19/2023    EOSA 3.4 12/19/2023    ABC 0.00 12/19/2023    MCV 89.4 12/19/2023    MCH 30.3 12/19/2023    MCHC 34.0 12/19/2023    MPV 8.1 12/19/2023    RDW 13.1 12/19/2023         General Chemistry:   Lab Results   Component Value Date    NA 139 12/19/2023    K 3.9  12/19/2023    CL 104 12/19/2023    CO2 27 12/19/2023    GAP 8 12/19/2023    BUN 16 12/19/2023    CR 0.79 12/19/2023    GLU 84 12/19/2023    CA 9.5 12/19/2023    ALBUMIN 4.6 12/19/2023    TOTBILI 0.5 12/19/2023      Coagulation: No results found for: PT, PTT, INR    PAC Plan    Anesthesia Plan    ASA score: 2   Plan: general and TIVA  NPO status: acceptable      Informed Consent  Anesthetic plan and risks discussed with patient.        Plan discussed with: anesthesiologist, CRNA and surgeon/proceduralist.  Comments: (I have discussed the risks/benefits/possible complications of general anesthesia with the patient.  These have included dental/oral injury/tooth loss during airway manipulation, nausea/vomiting, delayed/prolonged emergence, and possible cardiac or pulmonary complications.  The patient expressed understanding and wishes to proceed with a general anesthetic.  )      Alerts         [1]   Allergies  Allergen Reactions    Sulfa (Sulfonamide Antibiotics) HIVES and ITCHING     Other reaction(s): Rash

## 2024-02-01 NOTE — Anesthesia Post-Procedure Evaluation
 Post-Anesthesia Evaluation    Name: Kara Ponce      MRN: [id number]     DOB: [birth year]     Age: 38 y.o.     Sex: female   __________________________________________________________________________     Procedure Information       Anesthesia Start Date/Time: 02/01/24 1423    Procedures:       Right open carpal tunnel release (Right: Wrist) - **      Right ring finger cyst excision (Right: Hand)      NEUROPLASTY/ TRANSPOSITION ULNAR NERVE AT ELBOW (Right: Elbow)      OPEN UMBILICAL HERNIA REPAIR (Abdomen) - **    Location: [facility] / [facility]    Surgeons: Carlean Lang ORN, MD; Shirlean Norleen JONELLE Mickey., MD            Post-Anesthesia Vitals  BP: 129/70 (09/12 1700)  Temp: 36.1 ?C (97 ?F) (09/12 1612)  Pulse: 70 (09/12 1700)  Respirations: 16 PER MINUTE (09/12 1320)  SpO2: 95 % (09/12 1700)  O2 Device: None (Room air) (09/12 1645)  Height: 153.2 cm (5' 0.32) (09/12 1304)   Vitals Value Taken Time   BP 129/70 02/01/24 17:00   Temp 36.1 ?C (97 ?F) 02/01/24 16:12   Pulse 70 02/01/24 17:00   Respirations     SpO2 95 % 02/01/24 17:00   O2 Device None (Room air) 02/01/24 16:45   ABP     ART BP           Post Anesthesia Evaluation Note    Evaluation location: pre/post  Patient participation: recovered; patient participated in evaluation  Level of consciousness: alert    Pain score: 4  Pain management: adequate    Hydration: normovolemia  Temperature: 36.0?C - 38.4?C  Airway patency: adequate    Perioperative Events       Post-op nausea and vomiting: no PONV    Postoperative Status  Cardiovascular status: hemodynamically stable  Respiratory status: spontaneous ventilation  Follow-up needed: none        Perioperative Events  There were no known complications for this encounter.

## 2024-02-01 NOTE — Anesthesia Procedure Notes
 Procedure: Airway Placement    AIRWAY INSERTION    Date/Time: 02/01/2024 2:26 PM          Patient location: OR  Urgency: elective  Difficult Airway: No        Airway Procedure  Indication(s) for airway management: surgery        no    Preoxygenated: yes  Patient position: sniffing  Neck stabilization: no in-line stabilization    Mask difficulty assessment: 0 - not attempted      Procedure Outcome  Final airway type: supraglottic airway (SGA)          Supraglottic airway: LMA and unique; SGA size: 4;                      Number of attempts at approach: 1                Performed by: Crandall Harvel, Hoy Arlean Hasten, CRNA  Authorized by: Freda Norleen PARAS, MD

## 2024-02-03 ENCOUNTER — Encounter: Admit: 2024-02-03 | Discharge: 2024-02-03 | Payer: BLUE CROSS/BLUE SHIELD

## 2024-02-07 ENCOUNTER — Encounter: Admit: 2024-02-07 | Discharge: 2024-02-07 | Payer: BLUE CROSS/BLUE SHIELD

## 2024-02-08 ENCOUNTER — Encounter: Admit: 2024-02-08 | Discharge: 2024-02-08 | Payer: BLUE CROSS/BLUE SHIELD

## 2024-02-08 MED FILL — LEVOTHYROXINE 75 MCG PO TAB: 75 ug | ORAL | 90 days supply | Qty: 90 | Fill #3 | Status: AC

## 2024-02-09 ENCOUNTER — Encounter: Admit: 2024-02-09 | Discharge: 2024-02-09 | Payer: BLUE CROSS/BLUE SHIELD

## 2024-02-09 MED FILL — SERTRALINE 50 MG PO TAB: 50 mg | ORAL | 90 days supply | Qty: 90 | Fill #0 | Status: AC

## 2024-02-11 ENCOUNTER — Ambulatory Visit: Admit: 2024-02-11 | Discharge: 2024-02-11 | Payer: BLUE CROSS/BLUE SHIELD

## 2024-02-11 ENCOUNTER — Encounter: Admit: 2024-02-11 | Discharge: 2024-02-11 | Payer: BLUE CROSS/BLUE SHIELD

## 2024-02-11 DIAGNOSIS — N6321 Unspecified lump in the left breast, upper outer quadrant: Principal | ICD-10-CM

## 2024-02-12 ENCOUNTER — Encounter: Admit: 2024-02-12 | Discharge: 2024-02-12 | Payer: BLUE CROSS/BLUE SHIELD

## 2024-02-12 ENCOUNTER — Ambulatory Visit: Admit: 2024-02-12 | Discharge: 2024-02-12 | Payer: BLUE CROSS/BLUE SHIELD

## 2024-02-12 DIAGNOSIS — N6321 Unspecified lump in the left breast, upper outer quadrant: Principal | ICD-10-CM

## 2024-02-12 NOTE — Telephone Encounter
 Received a VM from pt the breast imaging center stating that the pt had the targeted US  completed today and that the radiologist is recommending that the pt have a L US  breast Bx completed.  An order is needed.  The pt is scheduled to have it completed on Friday at the [place] location.    Per L targeted breast US  results from today, within the left breast at 12:30, 4 cm from the nipple, corresponding to the finding seen on automated screening breast ultrasound, there is a mildly irregular hypoechoic parallel mass with some angular margins with internal blood flow measuring 1.0 x 0.6 x 1.1 cm.  An US -guided L breast bx is recommended.  Can see that the pt is scheduled at [facility] for the bx on 02/15/24 at 1330.    Order placed.  Erin at [facility] notified.

## 2024-02-14 ENCOUNTER — Encounter: Admit: 2024-02-14 | Discharge: 2024-02-14 | Payer: BLUE CROSS/BLUE SHIELD

## 2024-02-14 ENCOUNTER — Ambulatory Visit: Admit: 2024-02-14 | Discharge: 2024-02-15 | Payer: BLUE CROSS/BLUE SHIELD

## 2024-02-14 VITALS — Ht 60.315 in | Wt 155.6 lb

## 2024-02-14 DIAGNOSIS — G5601 Carpal tunnel syndrome, right upper limb: Principal | ICD-10-CM

## 2024-02-14 NOTE — Progress Notes
 Procedure: R CTR/ulnar nerve transposition/ring finger cyst excision 02/01/24    Interval Hx:    Pain has been well controlled. There are no new complaints and has been compliant with the postoperative protocol. Denies any fevers, chills or any other general complaints.    PE:  On evaluation of the right upper extremity, wounds are well healed. No drainage or signs of infection. Elbow and wrist motion well maintained. Composite finger flexion to the palm.     Imaging: none      A/P:     38 year-old female 2 weeks status post above. We discussed the above findings with the patient. The patient is doing well and as expected after the above operation. Sutures will be removed today. We reviewed continuing restrictions for the elbow. Follow up as needed. If any problems or worsening symptoms should arise, the patient will contact my office as soon as possible for a more immediate evaluation.  The patient understands and agrees to this above plan.

## 2024-02-15 ENCOUNTER — Encounter: Admit: 2024-02-15 | Discharge: 2024-02-15 | Payer: BLUE CROSS/BLUE SHIELD

## 2024-02-22 ENCOUNTER — Encounter: Admit: 2024-02-22 | Discharge: 2024-02-22 | Payer: BLUE CROSS/BLUE SHIELD

## 2024-03-24 ENCOUNTER — Encounter: Admit: 2024-03-24 | Discharge: 2024-03-24 | Payer: BLUE CROSS/BLUE SHIELD

## 2024-03-24 MED ORDER — METHYLPREDNISOLONE 4 MG PO DSPK
ORAL_TABLET | ORAL | 0 refills | 6.00000 days | Status: AC
Start: 2024-03-24 — End: ?
  Filled 2024-03-24: qty 21, 6d supply, fill #0

## 2024-04-14 ENCOUNTER — Encounter: Admit: 2024-04-14 | Discharge: 2024-04-14 | Payer: BLUE CROSS/BLUE SHIELD

## 2024-04-21 ENCOUNTER — Encounter: Admit: 2024-04-21 | Discharge: 2024-04-21 | Payer: BLUE CROSS/BLUE SHIELD

## 2024-04-21 MED FILL — NITROFURANTOIN MONOHYD/M-CRYST 100 MG PO CAP: 100 mg | ORAL | 7 days supply | Qty: 14 | Fill #0 | Status: CP

## 2024-04-21 MED FILL — FLUCONAZOLE 150 MG PO TAB: 150 mg | ORAL | 6 days supply | Qty: 2 | Fill #0 | Status: CP

## 2024-04-23 ENCOUNTER — Ambulatory Visit: Admit: 2024-04-23 | Discharge: 2024-04-23 | Payer: BLUE CROSS/BLUE SHIELD

## 2024-04-23 ENCOUNTER — Encounter: Admit: 2024-04-23 | Discharge: 2024-04-23 | Payer: BLUE CROSS/BLUE SHIELD

## 2024-04-23 VITALS — BP 112/74 | HR 78 | Temp 98.00000°F | Resp 16 | Ht 60.32 in | Wt 156.8 lb

## 2024-04-23 DIAGNOSIS — R350 Frequency of micturition: Secondary | ICD-10-CM

## 2024-04-23 DIAGNOSIS — H6992 Unspecified Eustachian tube disorder, left ear: Principal | ICD-10-CM

## 2024-04-23 NOTE — Progress Notes [1]
 Date of Service: 04/23/2024    Kara Ponce is a 38 y.o. female. DOB: [birth year]   MRN#: [id number]    Subjective:       Patient Reported Other  What topic(s) would you like to cover during your appointment?:  Currently having ear pain x 4 days  Please describe the issue(s) and history with the issue (location, severity, duration, symptoms, etc.).:  4 day duration. Pain under ear and inside  What has been done so far to take care of the issue(s)?:  Allergy pills  What are your goals for this visit?:  Evaluation of possible infection    Chief Complaint   Patient presents with    Ear Evaluation     Left ear discomfort that started 4 days ago. Notes that she is flying on Friday and would like to make sure she does not have an ear infection.     UTI     She is also concerned she may have a UTI as she has had increased urinary frequency recently.       History of Present Illness  The patient, with interstitial cystitis, presents with left ear pain and increased urinary frequency.    The patient has been experiencing left ear pain for the past four days, which is significant enough to disrupt sleep. They describe tenderness under the ear. Despite using a humidifier nightly and taking Benadryl  and Claritin, there has been no significant relief. Ibuprofen  has also been ineffective. No fever, cough, or congestion is present, though they suspect a possible post-nasal drip due to having young children.    They have a history of interstitial cystitis and report increased urinary frequency. A point-of-care urine test was normal. They manage symptoms with D-mannose and have seen improvement. They express concern about needing urgent care during an upcoming trip to [place] with their children.    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 They have young children and are planning a trip to [place].        Past Medical History:    Anxiety    History of chicken pox    History of migraine headaches    Hypothyroidism     Surgical History:   Procedure Laterality Date    HX WISDOM TEETH EXTRACTION  05/23/2003    CYSTOSCOPY  08/13/2009    NORMAL    rectal nodule N/A 12/26/2017    Performed by Leonce Emerick PARAS, DO at [facility] ENDO    Right cubital tunnel release, possible transposition Right 01/14/2018    Performed by Zada FORBES Pin, MD at [facility] OR    ANORECTAL MANOMETRY N/A 02/15/2018    Performed by Noralyn Blossom, MD at [facility] ENDO    HX MICROPHLEBECTOMY Left 04/05/2021    left leg microphlebectomy- Dr. Lenton    Right open carpal tunnel release Right 02/01/2024    Performed by Carlean Lang ORN, MD at [facility] OR    Right ring finger cyst excision Right 02/01/2024    Performed by Carlean Lang ORN, MD at [facility] OR    NEUROPLASTY/ TRANSPOSITION ULNAR NERVE AT ELBOW Right 02/01/2024    Performed by Carlean Lang ORN, MD at [facility] OR    OPEN UMBILICAL HERNIA REPAIR  02/01/2024    Performed by Shirlean Norleen JONELLE Mickey., MD at [facility] OR    ELBOW SURGERY  Right ulnar nerve decompression August 26,2019    HX CARPAL TUNNEL RELEASE  Family History   Problem Relation Name Age of Onset    Hypertension Mother      Thyroid Disease Mother      Heart Disease Mother      Hypertension Father      Stroke Father      Hearing Loss Father      High Cholesterol Father      Coronary Artery Disease Father  84        s/p CABG    Colon Polyps Father      Diabetes Maternal Grandfather      Heart Disease Paternal Grandmother      Heart Disease Paternal Grandfather      Anesthetic Complication Neg Hx      Cancer-Breast Neg Hx      Cancer-Colon Neg Hx      Cancer-Ovarian Neg Hx      Cancer-Uterine Neg Hx      Bleeding Disorders Neg Hx      VTE Neg Hx      Cancer Neg Hx       Social History[1]  Vaping/E-liquid Use    Vaping Use Never User                       Objective:      acetaminophen  (TYLENOL  EXTRA STRENGTH) 500 mg tablet Take one tablet by mouth every 4 hours as needed for Pain. Max of 4,000 mg of acetaminophen  in 24 hours.  Indications: pain erenumab -aooe (AIMOVIG  AUTOINJECTOR) 70 mg/mL injection syringe Inject 1 mL under the skin every 30 days. Indications: migraine prevention    fluconazole  (DIFLUCAN ) 150 mg tablet Take one tablet by mouth now then may repeat again after 3 days if needed.    hydroquinone  (ELDOQUIN FORTE) 4 % cream Apply  topically to affected area twice daily.    ibuprofen  (MOTRIN ) 400 mg tablet Take one tablet by mouth every 6 hours as needed for Pain. Take with food.  Indications: pain    levothyroxine  (SYNTHROID ) 75 mcg tablet Take one tablet by mouth daily.    methylPREDNIsolone  (MEDROL  (PAK)) 4 mg tablet Take medication as directed on package for 6 days. Take with food.    MULTIVITAMIN PO Take  by mouth.    nitrofurantoin  monohyd/m-cryst (MACROBID ) 100 mg capsule Take one capsule by mouth twice daily for 7 days.    ondansetron  (ZOFRAN  ODT) 4 mg rapid dissolve tablet Dissolve one tablet by mouth every 6 hours. Place on tongue to dissolve.  Indications: nausea    other medication Apply to affected areas Topical 2-3 times weekly increase to daily as tolerated for 30 days    oxyCODONE -acetaminophen  (PERCOCET) 5-325 mg tablet Take one tablet by mouth every 4 hours as needed for Pain. Indications: pain    polyethylene glycol 3350 (MIRALAX) 17 g packet Take one packet by mouth daily. Indications: constipation    rizatriptan  (MAXALT -MLT) 10 mg rapid dissolve tablet Dissolve one tablet by mouth at onset of headache. May repeat after 2 hours. Max of 30 mg in 24 hours.    sennosides-docusate sodium (SENOKOT-S) 8.6/50 mg tablet Take one tablet by mouth twice daily. While taking narcotic pain medication  Indications: constipation    sertraline  (ZOLOFT ) 50 mg tablet Take one tablet by mouth daily.    triamcinolone  acetonide (KENALOG ) 0.1 % topical cream Apply  topically to affected area twice daily up to 4 weeks when flared    ubrogepant  (UBRELVY ) 100 mg tablet Take one tablet by mouth daily as  needed. May repeat once after 2 hours based on response.  Indications: a migraine headache     Vitals:    04/23/24 0930   BP: 112/74   BP Source: Arm, Left Upper   Pulse: 78   Temp: 36.7 ?C (98 ?F)   Resp: 16   SpO2: 100%   O2 Device: None (Room air)   TempSrc: Temporal   PainSc: Two   Weight: 71.1 kg (156 lb 12.8 oz)   Height: 153.2 cm (5' 0.32)     Body mass index is 30.3 kg/m?SABRA     Physical Exam  Vitals reviewed.   Constitutional:       General: She is not in acute distress.     Appearance: Normal appearance.   HENT:      Right Ear: A middle ear effusion is present. Tympanic membrane is not erythematous or bulging.      Left Ear: A middle ear effusion is present. Tympanic membrane is not erythematous or bulging.      Nose: Nose normal.   Eyes:      Conjunctiva/sclera: Conjunctivae normal.   Cardiovascular:      Rate and Rhythm: Normal rate.   Pulmonary:      Effort: Pulmonary effort is normal.   Musculoskeletal:         General: Normal range of motion.      Cervical back: Normal range of motion.   Neurological:      Mental Status: She is alert and oriented to person, place, and time.   Psychiatric:         Mood and Affect: Mood and affect normal.            Assessment and Plan:    Jalayne was seen today for ear evaluation and uti.    Diagnoses and all orders for this visit:    Eustachian tube dysfunction, left    Urinary frequency  -     URINALYSIS, MICROSCOPIC; Future  -     POC URINE DIPSTICK AUTO READ  -     CULTURE-URINE W/SENSITIVITY; Future      Assessment & Plan  Eustachian tube dysfunction  Possible fluid behind the ear causing discomfort. No infection signs.  - Continue Claritin.  - Start Flonase, two sprays each nostril daily.  - Consider Sudafed before flight.    Interstitial cystitis  Increased urinary frequency. Urine test normal, culture pending.  - Sent urine culture.  - Continue D-mannose and [place].                                  [1]   Social History  Socioeconomic History    Marital status: Married     Spouse name: Shyrl    Number of children: 2   Occupational History    Occupation: Education Administrator: [employer]   Tobacco Use    Smoking status: Never     Passive exposure: Never    Smokeless tobacco: Never   Vaping Use    Vaping status: Never Used   Substance and Sexual Activity    Alcohol use: Yes     Alcohol/week: 1.0 - 2.0 standard drink of alcohol     Types: 1 - 2 Standard drinks or equivalent per week    Drug use: Never    Sexual activity: Yes     Partners: Male     Birth control/protection: I.U.D.  Other Topics Concern    Caffeine Concern Yes     Comment: 1 cup per day    Exercise Yes     Comment: 5x per week, run , elliptical    Seat Belt Yes    Self-Exams Yes   Social History Narrative    She works as a garment/textile technologist for [employer].  She has two young children.

## 2024-04-29 ENCOUNTER — Encounter: Admit: 2024-04-29 | Discharge: 2024-04-29 | Payer: BLUE CROSS/BLUE SHIELD

## 2024-04-30 ENCOUNTER — Encounter: Admit: 2024-04-30 | Discharge: 2024-04-30 | Payer: BLUE CROSS/BLUE SHIELD

## 2024-05-01 ENCOUNTER — Encounter: Admit: 2024-05-01 | Discharge: 2024-05-01 | Payer: BLUE CROSS/BLUE SHIELD

## 2024-05-01 MED FILL — AIMOVIG AUTOINJECTOR 70 MG/ML SC ATIN: 70 mg/mL | SUBCUTANEOUS | 90 days supply | Qty: 3 | Fill #1 | Status: AC

## 2024-05-02 ENCOUNTER — Encounter: Admit: 2024-05-02 | Discharge: 2024-05-02 | Payer: BLUE CROSS/BLUE SHIELD

## 2024-05-02 MED FILL — RIZATRIPTAN 10 MG PO TBDI: 10 mg | ORAL | 30 days supply | Qty: 10 | Fill #0 | Status: AC

## 2024-05-02 MED FILL — TRIAMCINOLONE ACETONIDE 0.1 % TP CREA: 0.1 % | TOPICAL | 30 days supply | Qty: 30 | Fill #2 | Status: AC

## 2024-05-02 MED FILL — UBRELVY 100 MG PO TAB: 100 mg | ORAL | 30 days supply | Qty: 10 | Fill #1 | Status: AC

## 2024-05-02 MED FILL — LEVOTHYROXINE 75 MCG PO TAB: 75 ug | ORAL | 90 days supply | Qty: 90 | Fill #0 | Status: AC

## 2024-05-02 MED FILL — SERTRALINE 50 MG PO TAB: 50 mg | ORAL | 90 days supply | Qty: 90 | Fill #0 | Status: AC

## 2024-05-30 ENCOUNTER — Encounter: Admit: 2024-05-30 | Discharge: 2024-05-30 | Payer: PRIVATE HEALTH INSURANCE

## 2024-05-30 DIAGNOSIS — G43009 Migraine without aura, not intractable, without status migrainosus: Principal | ICD-10-CM

## 2024-05-30 MED ORDER — RIZATRIPTAN 10 MG PO TBDI
ORAL_TABLET | ORAL | 0 refills | 30.00000 days | Status: AC
Start: 2024-05-30 — End: ?
  Filled 2024-06-02: qty 10, 30d supply, fill #0

## 2024-06-01 ENCOUNTER — Encounter: Admit: 2024-06-01 | Discharge: 2024-06-01 | Payer: PRIVATE HEALTH INSURANCE

## 2024-06-02 ENCOUNTER — Encounter: Admit: 2024-06-02 | Discharge: 2024-06-02 | Payer: PRIVATE HEALTH INSURANCE

## 2024-06-10 ENCOUNTER — Encounter: Admit: 2024-06-10 | Discharge: 2024-06-10 | Payer: PRIVATE HEALTH INSURANCE
